# Patient Record
Sex: Male | Born: 1995 | Race: Black or African American | Hispanic: No | Marital: Single | Smoking: Current some day smoker
Health system: Southern US, Community
[De-identification: ages and names within clinical notes are randomized; demographics above are authoritative.]

## PROBLEM LIST (undated history)

## (undated) ENCOUNTER — Emergency Department (HOSPITAL_COMMUNITY): Admission: EM | Payer: Self-pay | Source: Home / Self Care

## (undated) HISTORY — PX: WISDOM TOOTH EXTRACTION: SHX21

---
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
## 2016-04-03 ENCOUNTER — Emergency Department (HOSPITAL_COMMUNITY)
Admission: EM | Admit: 2016-04-03 | Discharge: 2016-04-03 | Disposition: A | Discharge status: Home / Self Care | Payer: Medicaid Other | Attending: Emergency Medicine | Admitting: Emergency Medicine

## 2016-04-03 ENCOUNTER — Encounter (HOSPITAL_COMMUNITY): Payer: Self-pay | Admitting: Emergency Medicine

## 2016-04-03 DIAGNOSIS — B009 Herpesviral infection, unspecified: Secondary | ICD-10-CM | POA: Diagnosis not present

## 2016-04-03 DIAGNOSIS — Z87891 Personal history of nicotine dependence: Secondary | ICD-10-CM | POA: Diagnosis not present

## 2016-04-03 DIAGNOSIS — K1379 Other lesions of oral mucosa: Secondary | ICD-10-CM | POA: Diagnosis present

## 2016-04-03 MED ORDER — ACYCLOVIR 800 MG PO TABS: 400.0000 mg | ORAL_TABLET | Freq: Four times a day (QID) | ORAL | 0 refills | Status: DC

## 2016-04-03 NOTE — ED Provider Notes (Signed)
[facility] Provider Note   CSN: [id number] Arrival date & time: 04/03/16  0908     History   Chief Complaint Chief Complaint  Patient presents with  . Mouth Lesions    HPI Kevin Cortez is a 20 y.o. male.  The history is provided by the patient. No language interpreter was used.  Mouth Lesions  Location:  Upper lip Upper lip location:  L outer Quality:  Blistered and red Onset quality:  Sudden Severity:  Moderate Progression:  Worsening Chronicity:  New Relieved by:  Nothing Worsened by:  Nothing Ineffective treatments:  None tried Associated symptoms: no rash and no sore throat   Pt complains of sores on the side of his mouth.  Pt has had in the past.  History reviewed. No pertinent past medical history.  There are no active problems to display for this patient.   History reviewed. No pertinent surgical history.     Home Medications    Prior to Admission medications   Medication Sig Start Date End Date Taking? Authorizing Provider  acyclovir (ZOVIRAX) 800 MG tablet Take 0.5 tablets (400 mg total) by mouth 4 (four) times daily. 04/03/16   Elson AreasLeslie K Shylynn Bruning, PA-C  ibuprofen (ADVIL,MOTRIN) 200 MG tablet Take 200 mg by mouth every 6 (six) hours as needed for headache.    Historical Provider, MD    Family History Family History  Problem Relation Age of Onset  . Hypertension Mother   . Hypertension Other     Social History Social History  Substance Use Topics  . Smoking status: Never Smoker  . Smokeless tobacco: Former NeurosurgeonUser  . Alcohol use No     Allergies   Amoxicillin   Review of Systems Review of Systems  HENT: Positive for mouth sores. Negative for sore throat.   Skin: Negative for rash.  All other systems reviewed and are negative.    Physical Exam Updated Vital Signs BP 141/95 (BP Location: Right Arm)   Pulse (!) 56   Temp 98.2 F (36.8 C) (Oral)   Resp 14   Ht 5\' 10"  (1.778 m)   Wt 70.3 kg   SpO2 100%   BMI 22.24 kg/m     Physical Exam  Constitutional: He is oriented to person, place, and time. He appears well-developed and well-nourished.  HENT:  Head: Normocephalic and atraumatic.  Right Ear: External ear normal.  Left Ear: External ear normal.  Nose: Nose normal.  Red raised blisters left outer upper lip.  Looks herpectic  Eyes: Conjunctivae and EOM are normal.  Neck: Normal range of motion. Neck supple.  Cardiovascular: Normal rate and regular rhythm.   No murmur heard. Pulmonary/Chest: Effort normal and breath sounds normal. No respiratory distress.  Abdominal: Soft. He exhibits no distension. There is no tenderness.  Musculoskeletal: Normal range of motion. He exhibits no edema.  Neurological: He is alert and oriented to person, place, and time.  Skin: Skin is warm and dry.  Psychiatric: He has a normal mood and affect.  Nursing note and vitals reviewed.    ED Treatments / Results  Labs (all labs ordered are listed, but only abnormal results are displayed) Labs Reviewed - No data to display  EKG  EKG Interpretation None       Radiology No results found.  Procedures Procedures (including critical care time)  Medications Ordered in ED Medications - No data to display   Initial Impression / Assessment and Plan / ED Course  I have reviewed the triage  vital signs and the nursing notes.  Pertinent labs & imaging results that were available during my care of the patient were reviewed by me and considered in my medical decision making (see chart for details).  Clinical Course    Pt counseled on probable herpes virus.  Pt given rx for acyclovir  Final Clinical Impressions(s) / ED Diagnoses   Final diagnoses:  Herpes simplex virus infection    New Prescriptions Discharge Medication List as of 04/03/2016  9:45 AM    START taking these medications   Details  acyclovir (ZOVIRAX) 800 MG tablet Take 0.5 tablets (400 mg total) by mouth 4 (four) times daily., Starting Wed  04/03/2016, Print         Lonia Skinner [place], PA-C 04/03/16 1142    Linwood Dibbles, MD 04/05/16 [phone]

## 2016-04-03 NOTE — ED Triage Notes (Signed)
Patient complaining of rash and soreness to left side of mouth since yesterday. States he has had a cold sore in same place in the past.

## 2016-04-29 ENCOUNTER — Emergency Department (HOSPITAL_COMMUNITY)
Admission: EM | Admit: 2016-04-29 | Discharge: 2016-04-30 | Disposition: A | Discharge status: Home / Self Care | Payer: Medicaid Other | Attending: Emergency Medicine | Admitting: Emergency Medicine

## 2016-04-29 ENCOUNTER — Encounter (HOSPITAL_COMMUNITY): Payer: Self-pay | Admitting: Emergency Medicine

## 2016-04-29 DIAGNOSIS — Z791 Long term (current) use of non-steroidal anti-inflammatories (NSAID): Secondary | ICD-10-CM | POA: Diagnosis not present

## 2016-04-29 DIAGNOSIS — Z87891 Personal history of nicotine dependence: Secondary | ICD-10-CM | POA: Insufficient documentation

## 2016-04-29 DIAGNOSIS — R109 Unspecified abdominal pain: Secondary | ICD-10-CM | POA: Insufficient documentation

## 2016-04-29 DIAGNOSIS — J029 Acute pharyngitis, unspecified: Secondary | ICD-10-CM | POA: Insufficient documentation

## 2016-04-29 MED ORDER — CEPHALEXIN 500 MG PO CAPS: 500.0000 mg | ORAL_CAPSULE | Freq: Four times a day (QID) | ORAL | 0 refills | Status: DC

## 2016-04-29 NOTE — ED Notes (Signed)
Pt came to registration asking for work note. Pt no longer in waiting room.

## 2016-04-29 NOTE — ED Provider Notes (Signed)
[facility] Provider Note   CSN: [id number] Arrival date & time: 04/29/16  1948     History   Chief Complaint Chief Complaint  Patient presents with  . Sore Throat  . Abdominal Pain    HPI Kevin Cortez is a 20 y.o. male.  The history is provided by the patient. No language interpreter was used.  Sore Throat  This is a new problem. The current episode started 2 days ago. The problem has been gradually worsening. Associated symptoms include abdominal pain. Nothing aggravates the symptoms. Nothing relieves the symptoms. He has tried nothing for the symptoms.  Abdominal Pain    Pt reports no current abdominal pain.  Pt reports he had pain earlier today.    History reviewed. No pertinent past medical history.  There are no active problems to display for this patient.   History reviewed. No pertinent surgical history.     Home Medications    Prior to Admission medications   Medication Sig Start Date End Date Taking? Authorizing Provider  acyclovir (ZOVIRAX) 800 MG tablet Take 0.5 tablets (400 mg total) by mouth 4 (four) times daily. 04/03/16   Elson Areas, PA-C  ibuprofen (ADVIL,MOTRIN) 200 MG tablet Take 200 mg by mouth every 6 (six) hours as needed for headache.    Historical Provider, MD    Family History Family History  Problem Relation Age of Onset  . Hypertension Mother   . Hypertension Other     Social History Social History  Substance Use Topics  . Smoking status: Never Smoker  . Smokeless tobacco: Former Neurosurgeon  . Alcohol use No     Allergies   Amoxicillin   Review of Systems Review of Systems  Gastrointestinal: Positive for abdominal pain.  All other systems reviewed and are negative.    Physical Exam Updated Vital Signs BP 135/87   Pulse 66   Temp 98 F (36.7 C) (Oral)   Resp 17   Ht 5\' 10"  (1.778 m)   Wt 70.3 kg   SpO2 100%   BMI 22.24 kg/m   Physical Exam  Constitutional: He appears well-developed and  well-nourished.  HENT:  Head: Normocephalic and atraumatic.  Erythema throat, no exudate  Eyes: Conjunctivae are normal.  Neck: Neck supple.  Cardiovascular: Normal rate and regular rhythm.   No murmur heard. Pulmonary/Chest: Effort normal and breath sounds normal. No respiratory distress.  Abdominal: Soft. There is no tenderness.  Musculoskeletal: He exhibits no edema.  Neurological: He is alert.  Skin: Skin is warm and dry.  Psychiatric: He has a normal mood and affect.  Nursing note and vitals reviewed.    ED Treatments / Results  Labs (all labs ordered are listed, but only abnormal results are displayed) Labs Reviewed - No data to display  EKG  EKG Interpretation None       Radiology No results found.  Procedures Procedures (including critical care time)  Medications Ordered in ED Medications - No data to display   Initial Impression / Assessment and Plan / ED Course  I have reviewed the triage vital signs and the nursing notes.  Pertinent labs & imaging results that were available during my care of the patient were reviewed by me and considered in my medical decision making (see chart for details).  Clinical Course    No outpatient prescriptions have been marked as taking for the 04/29/16 encounter [facility] Encounter).    Final Clinical Impressions(s) / ED Diagnoses   Final diagnoses:  Pharyngitis,  unspecified etiology    New Prescriptions New Prescriptions   CEPHALEXIN (KEFLEX) 500 MG CAPSULE    Take 1 capsule (500 mg total) by mouth 4 (four) times daily.  An After Visit Summary was printed and given to the patient.   Lonia SkinnerLeslie K LingleSofia, PA-C 04/29/16 2335    Canary Brimhristopher J Tegeler, MD 05/01/16 [phone]

## 2016-04-29 NOTE — ED Triage Notes (Signed)
Pt states throat and abdomen began hurting last week. Pt denies N/V/D.

## 2016-07-04 ENCOUNTER — Emergency Department (HOSPITAL_COMMUNITY)
Admission: EM | Admit: 2016-07-04 | Discharge: 2016-07-04 | Disposition: A | Discharge status: Home / Self Care | Payer: Medicaid Other | Attending: Emergency Medicine | Admitting: Emergency Medicine

## 2016-07-04 ENCOUNTER — Encounter (HOSPITAL_COMMUNITY): Payer: Self-pay | Admitting: *Deleted

## 2016-07-04 ENCOUNTER — Emergency Department (HOSPITAL_COMMUNITY): Payer: Medicaid Other

## 2016-07-04 DIAGNOSIS — Z87891 Personal history of nicotine dependence: Secondary | ICD-10-CM | POA: Diagnosis not present

## 2016-07-04 DIAGNOSIS — R69 Illness, unspecified: Secondary | ICD-10-CM

## 2016-07-04 DIAGNOSIS — R05 Cough: Secondary | ICD-10-CM | POA: Diagnosis present

## 2016-07-04 DIAGNOSIS — B349 Viral infection, unspecified: Secondary | ICD-10-CM | POA: Diagnosis not present

## 2016-07-04 DIAGNOSIS — J111 Influenza due to unidentified influenza virus with other respiratory manifestations: Secondary | ICD-10-CM

## 2016-07-04 MED ORDER — BENZONATATE 100 MG PO CAPS: 100.0000 mg | ORAL_CAPSULE | Freq: Three times a day (TID) | ORAL | 0 refills | Status: DC

## 2016-07-04 MED ORDER — IBUPROFEN 800 MG PO TABS
800.0000 mg | ORAL_TABLET | Freq: Once | ORAL | Status: AC
  Administered 2016-07-04: 800 mg via ORAL
  Filled 2016-07-04: qty 1

## 2016-07-04 NOTE — Discharge Instructions (Signed)
You likely have a flu-like illness. This can take 1-2 weeks to improve on it's own. Drink plenty of fluids and get rest. Take ibuprofen or tylenol for aches and pains  Return for worsening symptoms, including new high fevers, confusion, difficulty breathing,  intractable vomiting, or any other symptoms concerning to you.

## 2016-07-04 NOTE — ED Provider Notes (Signed)
[facility] Provider Note   CSN: [id number] Arrival date & time: 07/04/16  0106     History   Chief Complaint Chief Complaint  Patient presents with  . Generalized Body Aches    HPI Lauro RegulusWalter L Sippel is a 20 y.o. male.  HPI 20 year old male who presents with cough and generalized body aches. Symptoms have been ongoing for 4 days. He has minimally productive cough but no shortness of breath or chest pain. Has had sore throat, congestion, and runny nose, and fullness in his ears. No known fevers or chills. Has had generalized myalgias and arthralgias. Has had some loose stools but no vomiting. Unknown sick contacts. Drinking fluids adequately at home. Has not taken medications at home.    History reviewed. No pertinent past medical history.  There are no active problems to display for this patient.   History reviewed. No pertinent surgical history.     Home Medications    Prior to Admission medications   Medication Sig Start Date End Date Taking? Authorizing Provider  benzonatate (TESSALON) 100 MG capsule Take 1 capsule (100 mg total) by mouth every 8 (eight) hours. 07/04/16   Lavera Guiseana Duo Georgeanne Frankland, MD    Family History Family History  Problem Relation Age of Onset  . Hypertension Mother   . Hypertension Other     Social History Social History  Substance Use Topics  . Smoking status: Never Smoker  . Smokeless tobacco: Former NeurosurgeonUser  . Alcohol use No     Allergies   Amoxicillin   Review of Systems Review of Systems 10/14 systems reviewed and are negative other than those stated in the HPI   Physical Exam Updated Vital Signs BP 127/85 (BP Location: Left Arm)   Pulse 73   Temp 98.2 F (36.8 C) (Oral)   Resp 16   Ht 5' 9.5" (1.765 m)   Wt 155 lb (70.3 kg)   SpO2 99%   BMI 22.56 kg/m   Physical Exam Physical Exam  Nursing note and vitals reviewed. Constitutional: non-toxic, and in no acute distress Head: Normocephalic and atraumatic.    Mouth/Throat: Oropharynx is clear and moist.  Neck: Normal range of motion. Neck supple.  Cardiovascular: Normal rate and regular rhythm.   Pulmonary/Chest: Effort normal and breath sounds normal.  Abdominal: Soft. There is no tenderness. There is no rebound and no guarding.  Musculoskeletal: Normal range of motion.  Neurological: Alert, no facial droop, fluent speech, moves all extremities symmetrically Skin: Skin is warm and dry.  Psychiatric: Cooperative   ED Treatments / Results  Labs (all labs ordered are listed, but only abnormal results are displayed) Labs Reviewed - No data to display  EKG  EKG Interpretation None       Radiology Dg Chest 2 View  Result Date: 07/04/2016 CLINICAL DATA:  Cough and body aches for 1 week. EXAM: CHEST  2 VIEW COMPARISON:  01/15/2013 FINDINGS: The cardiomediastinal contours are normal. The lungs are clear. Pulmonary vasculature is normal. No consolidation, pleural effusion, or pneumothorax. No acute osseous abnormalities are seen. IMPRESSION: No active cardiopulmonary disease. Electronically Signed   By: Rubye OaksMelanie  Ehinger M.D.   On: 07/04/2016 02:40    Procedures Procedures (including critical care time)  Medications Ordered in ED Medications  ibuprofen (ADVIL,MOTRIN) tablet 800 mg (800 mg Oral Given 07/04/16 0232)     Initial Impression / Assessment and Plan / ED Course  I have reviewed the triage vital signs and the nursing notes.  Pertinent labs & imaging results  that were available during my care of the patient were reviewed by me and considered in my medical decision making (see chart for details).  Clinical Course     Presenting with flulike illness. He is nontoxic in no acute distress with normal vital signs. Chest x-ray visualized and shows no infiltrate, edema or other acute cardiopulmonary processes. Discussed supportive care management for home. Strict return and follow-up instructions reviewed. He expressed understanding  of all discharge instructions and felt comfortable with the plan of care.   Final Clinical Impressions(s) / ED Diagnoses   Final diagnoses:  Influenza-like illness  Viral syndrome    New Prescriptions New Prescriptions   BENZONATATE (TESSALON) 100 MG CAPSULE    Take 1 capsule (100 mg total) by mouth every 8 (eight) hours.     Lavera Guiseana Duo Maguadalupe Lata, MD 07/04/16 [phone]

## 2016-07-04 NOTE — ED Triage Notes (Signed)
Pt c/o generalized body aches with chills, nausea and diarrhea

## 2016-07-28 ENCOUNTER — Emergency Department (HOSPITAL_COMMUNITY)
Admission: EM | Admit: 2016-07-28 | Discharge: 2016-07-28 | Disposition: A | Discharge status: Home / Self Care | Payer: Medicaid Other | Attending: Emergency Medicine | Admitting: Emergency Medicine

## 2016-07-28 ENCOUNTER — Encounter (HOSPITAL_COMMUNITY): Payer: Self-pay | Admitting: Emergency Medicine

## 2016-07-28 DIAGNOSIS — Z87891 Personal history of nicotine dependence: Secondary | ICD-10-CM | POA: Insufficient documentation

## 2016-07-28 DIAGNOSIS — R69 Illness, unspecified: Secondary | ICD-10-CM

## 2016-07-28 DIAGNOSIS — R05 Cough: Secondary | ICD-10-CM | POA: Diagnosis not present

## 2016-07-28 DIAGNOSIS — R197 Diarrhea, unspecified: Secondary | ICD-10-CM | POA: Diagnosis not present

## 2016-07-28 DIAGNOSIS — J029 Acute pharyngitis, unspecified: Secondary | ICD-10-CM | POA: Diagnosis not present

## 2016-07-28 DIAGNOSIS — R109 Unspecified abdominal pain: Secondary | ICD-10-CM | POA: Insufficient documentation

## 2016-07-28 DIAGNOSIS — R51 Headache: Secondary | ICD-10-CM | POA: Diagnosis present

## 2016-07-28 DIAGNOSIS — J111 Influenza due to unidentified influenza virus with other respiratory manifestations: Secondary | ICD-10-CM

## 2016-07-28 MED ORDER — ONDANSETRON 8 MG PO TBDP: 8.0000 mg | ORAL_TABLET | Freq: Three times a day (TID) | ORAL | 0 refills | Status: DC | PRN

## 2016-07-28 MED ORDER — GUAIFENESIN-DM 100-10 MG/5ML PO SYRP: 5.0000 mL | ORAL_SOLUTION | ORAL | 0 refills | Status: DC | PRN

## 2016-07-28 NOTE — Discharge Instructions (Signed)
Take Tylenol for body aches and fever. Drink plenty of fluids. Rest. Take Zofran as prescribed as needed for nausea. Take cough medication as prescribed as needed for cough. Follow-up with family doctor not improving in 3-5 days. Return if worsening symptoms.

## 2016-07-28 NOTE — ED Provider Notes (Signed)
[facility]-EMER[facility] DEPT Provider Note   CSN: [id number] Arrival date & time: 07/28/16  1219  By signing my name below, I, Kevin Cortez, attest that this documentation has been prepared under the direction and in the presence of Kevin Crumbleatyana Aryel Edelen, PA-C Electronically Signed: Cynda AcresHailei Cortez, Scribe. 07/28/16. 12:56 PM.  History   Chief Complaint Chief Complaint  Patient presents with  . Generalized Body Aches    HPI Comments: Kevin Cortez is a 21 y.o. male who presents to the Emergency Department complaining of constant generalized body aches that began 3 days ago. Patient reports an associated cough, abdominal pain, sore throat, diarrhea, and a headache. Patient states he works in a cold environment. Patient reports taking unknown migraine medication yesterday. He denies any sick contacts, fever, or vomiting. Unsure if had flu shot this year. No medications taken prior to coming in today.   The history is provided by the patient. No language interpreter was used.    History reviewed. No pertinent past medical history.  There are no active problems to display for this patient.   History reviewed. No pertinent surgical history.     Home Medications    Prior to Admission medications   Medication Sig Start Date End Date Taking? Authorizing Provider  benzonatate (TESSALON) 100 MG capsule Take 1 capsule (100 mg total) by mouth every 8 (eight) hours. Patient not taking: Reported on 07/28/2016 07/04/16   Kevin Guiseana Duo Liu, MD    Family History Family History  Problem Relation Age of Onset  . Hypertension Mother   . Hypertension Other     Social History Social History  Substance Use Topics  . Smoking status: Never Smoker  . Smokeless tobacco: Former NeurosurgeonUser  . Alcohol use No     Allergies   Amoxicillin   Review of Systems Review of Systems  Constitutional: Negative for chills and fever.  HENT: Positive for sore throat.   Respiratory: Positive for cough.     Gastrointestinal: Positive for abdominal pain and diarrhea. Negative for vomiting.  Musculoskeletal: Positive for myalgias.  Neurological: Positive for headaches.     Physical Exam Updated Vital Signs BP 129/97 (BP Location: Left Arm)   Pulse 83   Temp 98.7 F (37.1 C) (Oral)   Resp 18   Ht 5\' 10"  (1.778 m)   Wt 155 lb (70.3 kg)   SpO2 100%   BMI 22.24 kg/m   Physical Exam  Constitutional: He is oriented to person, place, and time. He appears well-developed and well-nourished. No distress.  HENT:  Head: Normocephalic and atraumatic.  Right Ear: External ear normal.  Left Ear: External ear normal.  Mouth/Throat: Oropharynx is clear and moist.  Clear rhinorrhea, pharynx erythemous, uvula midline  Eyes: Conjunctivae are normal.  Neck: Normal range of motion. Neck supple.  No meningeal signs  Cardiovascular: Normal rate, regular rhythm and normal heart sounds.   Pulmonary/Chest: Effort normal and breath sounds normal. No respiratory distress. He has no wheezes. He has no rales.  Abdominal: Soft. Bowel sounds are normal. There is no tenderness.  Musculoskeletal: He exhibits no edema or tenderness.  Lymphadenopathy:    He has no cervical adenopathy.  Neurological: He is alert and oriented to person, place, and time.  Skin: Skin is warm and dry. No erythema.  Psychiatric: He has a normal mood and affect.  Nursing note and vitals reviewed.    ED Treatments / Results  DIAGNOSTIC STUDIES: Oxygen Saturation is 100% on RA, normal by my interpretation.    COORDINATION  OF CARE: 12:53 PM Discussed treatment plan with pt at bedside and pt agreed to plan.  Labs (all labs ordered are listed, but only abnormal results are displayed) Labs Reviewed - No data to display  EKG  EKG Interpretation None       Radiology No results found.  Procedures Procedures (including critical care time)  Medications Ordered in ED Medications - No data to display   Initial Impression  / Assessment and Plan / ED Course  I have reviewed the triage vital signs and the nursing notes.  Pertinent labs & imaging results that were available during my care of the patient were reviewed by me and considered in my medical decision making (see chart for details).    Patient in emergency department with flulike symptoms. He is afebrile, normal vital signs, nontoxic appearing. Complaining of abdominal pain and diarrhea, abdomen is soft on exam, nontender. Lungs are clear. Oxygen saturation is 100% on room air. No respiratory distress. Oropharynx erythematous, otherwise no evidence of peritonsillar abscess or retropharyngeal abscess. Stable for discharge home, symptomatically treatment. We'll give her scheduled for Zofran for nausea. Will also treat with Tylenol, Motrin for any body aches and fever, Robitussin-DM for cough, rest, oral hydration at home, follow up as needed.  Vitals:   07/28/16 1234 07/28/16 1235  BP: 129/97   Pulse: 83   Resp: 18   Temp: 98.7 F (37.1 C)   TempSrc: Oral   SpO2: 100%   Weight:  70.3 kg  Height:  5\' 10"  (1.778 m)     Final Clinical Impressions(s) / ED Diagnoses   Final diagnoses:  Influenza-like illness    New Prescriptions New Prescriptions   GUAIFENESIN-DEXTROMETHORPHAN (ROBITUSSIN DM) 100-10 MG/5ML SYRUP    Take 5 mLs by mouth every 4 (four) hours as needed for cough.   ONDANSETRON (ZOFRAN ODT) 8 MG DISINTEGRATING TABLET    Take 1 tablet (8 mg total) by mouth every 8 (eight) hours as needed for nausea or vomiting.    I personally performed the services described in this documentation, which was scribed in my presence. The recorded information has been reviewed and is accurate.    Kevin Crumble, PA-C 07/28/16 1300    Kevin Bilis, MD 07/28/16 [phone]

## 2016-07-28 NOTE — ED Triage Notes (Signed)
PT c/o generalized body aches and cough x2 days. PT denies any OTC medications today and denies fever.

## 2016-07-29 ENCOUNTER — Emergency Department (HOSPITAL_COMMUNITY)
Admission: EM | Admit: 2016-07-29 | Discharge: 2016-07-29 | Disposition: A | Discharge status: Home / Self Care | Payer: Medicaid Other | Attending: Emergency Medicine | Admitting: Emergency Medicine

## 2016-07-29 ENCOUNTER — Encounter (HOSPITAL_COMMUNITY): Payer: Self-pay | Admitting: Emergency Medicine

## 2016-07-29 DIAGNOSIS — R51 Headache: Secondary | ICD-10-CM | POA: Diagnosis present

## 2016-07-29 DIAGNOSIS — J020 Streptococcal pharyngitis: Secondary | ICD-10-CM

## 2016-07-29 DIAGNOSIS — Z87891 Personal history of nicotine dependence: Secondary | ICD-10-CM | POA: Insufficient documentation

## 2016-07-29 DIAGNOSIS — Z79899 Other long term (current) drug therapy: Secondary | ICD-10-CM | POA: Diagnosis not present

## 2016-07-29 DIAGNOSIS — R109 Unspecified abdominal pain: Secondary | ICD-10-CM | POA: Diagnosis not present

## 2016-07-29 LAB — RAPID STREP SCREEN (MED CTR MEBANE ONLY): STREPTOCOCCUS, GROUP A SCREEN (DIRECT): POSITIVE — AB

## 2016-07-29 MED ORDER — CEPHALEXIN 500 MG PO CAPS
500.0000 mg | ORAL_CAPSULE | Freq: Once | ORAL | Status: AC
Start: 2016-07-29 — End: 2016-07-29
  Administered 2016-07-29: 500 mg via ORAL
  Filled 2016-07-29: qty 1

## 2016-07-29 MED ORDER — CEPHALEXIN 500 MG PO CAPS: 500.0000 mg | ORAL_CAPSULE | Freq: Two times a day (BID) | ORAL | 0 refills | Status: DC

## 2016-07-29 NOTE — Discharge Instructions (Signed)
Its important to drink plenty of fluids.  Alternate tylenol and ibuprofen 600 mg every 6 hrs for fever and body aches.  Be sure to take the antibiotic as directed for the entire 10 days.  Follow-up with your doctor for recheck if needed.

## 2016-07-29 NOTE — ED Triage Notes (Signed)
Pt c/o continued n/v/d and body aches. Pt seen in ED yesterday for same.

## 2016-07-29 NOTE — ED Provider Notes (Signed)
[facility] Provider Note   CSN: [id number] Arrival date & time: 07/29/16  1637  By signing my name below, I, [name], attest that this documentation has been prepared under the direction and in the presence of Lanice Folden PA-C.  Electronically Signed: [name], Scribe. 07/29/16. 7:12 PM.   History   Chief Complaint Chief Complaint  Patient presents with  . Abdominal Pain    HPI Comments: Kevin Cortez is a 21 y.o. male who presents to the Emergency Department complaining of abdominal pain that began 4 days ago. Patient states he was here yesterday and was told to return if symptoms worsened. Patient has associated generalized weakness, sore throat, headache, nausea, cough, vomiting, and diarrhea. Patient reports 2 vomiting episodes prior to arrival,  Patient has not taken  The medication prescribed to him. Patient does report taking tylenol. He denies any fever or any other symptoms.   The history is provided by the patient. No language interpreter was used.    History reviewed. No pertinent past medical history.  There are no active problems to display for this patient.   History reviewed. No pertinent surgical history.     Home Medications    Prior to Admission medications   Medication Sig Start Date End Date Taking? Authorizing Provider  benzonatate (TESSALON) 100 MG capsule Take 1 capsule (100 mg total) by mouth every 8 (eight) hours. Patient not taking: Reported on 07/28/2016 07/04/16   Lavera Guise, MD  guaiFENesin-dextromethorphan [facility] DM) 100-10 MG/5ML syrup Take 5 mLs by mouth every 4 (four) hours as needed for cough. 07/28/16   Tatyana Kirichenko, PA-C  ondansetron (ZOFRAN ODT) 8 MG disintegrating tablet Take 1 tablet (8 mg total) by mouth every 8 (eight) hours as needed for nausea or vomiting. 07/28/16   [name], PA-C    Family History Family History  Problem Relation Age of Onset  . Hypertension Mother   . Hypertension  Other     Social History Social History  Substance Use Topics  . Smoking status: Never Smoker  . Smokeless tobacco: Former Neurosurgeon  . Alcohol use No     Allergies   Amoxicillin   Review of Systems Review of Systems  Constitutional: Negative for chills and fever.  HENT: Positive for sore throat and trouble swallowing.   Respiratory: Positive for cough.   Gastrointestinal: Positive for abdominal pain, diarrhea and vomiting.  Neurological: Positive for weakness and headaches.     Physical Exam Updated Vital Signs BP 121/85 (BP Location: Left Arm)   Pulse 95   Temp 98.6 F (37 C) (Oral)   Resp 18   SpO2 99%   Physical Exam  Constitutional: He is oriented to person, place, and time. He appears well-developed and well-nourished.  HENT:  Head: Normocephalic and atraumatic.  erythema and edema of the oropharynx. No exudate. Uvula midline.   Neck: Normal range of motion. Neck supple.  Cardiovascular: Normal rate, regular rhythm and normal heart sounds.   No murmur heard. Pulmonary/Chest: Effort normal and breath sounds normal.  Abdominal: Soft. He exhibits no distension. There is no tenderness. There is no guarding.  Musculoskeletal: Normal range of motion.  Lymphadenopathy:    He has no cervical adenopathy.  Neurological: He is alert and oriented to person, place, and time.  Skin: Skin is warm and dry. No rash noted.     ED Treatments / Results  DIAGNOSTIC STUDIES: Oxygen Saturation is 99% on RA, normal by my interpretation.    COORDINATION OF  CARE: 7:09 PM Discussed treatment plan with pt at bedside and pt agreed to plan.  Labs (all labs ordered are listed, but only abnormal results are displayed) Labs Reviewed  RAPID STREP SCREEN (NOT AT [facility]) - Abnormal; Notable for the following:       Result Value   Streptococcus, Group A Screen (Direct) POSITIVE (*)    All other components within normal limits    EKG  EKG Interpretation None       Radiology No  results found.  Procedures Procedures (including critical care time)  Medications Ordered in ED Medications  cephALEXin (KEFLEX) capsule 500 mg (500 mg Oral Given 07/29/16 2057)     Initial Impression / Assessment and Plan / ED Course  I have reviewed the triage vital signs and the nursing notes.  Pertinent labs & imaging results that were available during my care of the patient were reviewed by me and considered in my medical decision making (see chart for details).   Patient is well appearing, afebrile at present. Here one day prior, diagnosed with viral illness.  Strep screen positive, PCN allergic, rx for keflex.  Pt appears stable for d/c agrees to plan   Final Clinical Impressions(s) / ED Diagnoses   Final diagnoses:  Strep throat    New Prescriptions Discharge Medication List as of 07/29/2016  8:50 PM    START taking these medications   Details  cephALEXin (KEFLEX) 500 MG capsule Take 1 capsule (500 mg total) by mouth 2 (two) times daily. For 10 days, Starting Mon 07/29/2016, Print       I personally performed the services described in this documentation, which was scribed in my presence. The recorded information has been reviewed and is accurate.     Pauline Ausammy Geraldine Sandberg, PA-C 08/01/16 1247    Bethann BerkshireJoseph Zammit, MD 08/01/16 [phone]

## 2016-09-19 ENCOUNTER — Encounter (HOSPITAL_COMMUNITY): Payer: Self-pay | Admitting: Emergency Medicine

## 2016-09-19 ENCOUNTER — Emergency Department (HOSPITAL_COMMUNITY)
Admission: EM | Admit: 2016-09-19 | Discharge: 2016-09-19 | Disposition: A | Discharge status: Home / Self Care | Payer: Medicaid Other | Attending: Emergency Medicine | Admitting: Emergency Medicine

## 2016-09-19 DIAGNOSIS — K625 Hemorrhage of anus and rectum: Secondary | ICD-10-CM | POA: Diagnosis present

## 2016-09-19 DIAGNOSIS — K602 Anal fissure, unspecified: Secondary | ICD-10-CM | POA: Diagnosis not present

## 2016-09-19 LAB — CBC
HEMATOCRIT: 39.6 % (ref 39.0–52.0)
HEMOGLOBIN: 13.5 g/dL (ref 13.0–17.0)
MCH: 29.6 pg (ref 26.0–34.0)
MCHC: 34.1 g/dL (ref 30.0–36.0)
MCV: 86.8 fL (ref 78.0–100.0)
Platelets: 240 10*3/uL (ref 150–400)
RBC: 4.56 MIL/uL (ref 4.22–5.81)
RDW: 12.2 % (ref 11.5–15.5)
WBC: 4.4 10*3/uL (ref 4.0–10.5)

## 2016-09-19 LAB — BASIC METABOLIC PANEL
Anion gap: 7 (ref 5–15)
BUN: 18 mg/dL (ref 6–20)
CHLORIDE: 105 mmol/L (ref 101–111)
CO2: 26 mmol/L (ref 22–32)
CREATININE: 1.02 mg/dL (ref 0.61–1.24)
Calcium: 9.1 mg/dL (ref 8.9–10.3)
GFR calc non Af Amer: >60 mL/min (ref >60)
GLUCOSE: 96 mg/dL (ref 65–99)
Potassium: 3.5 mmol/L (ref 3.5–5.1)
Sodium: 138 mmol/L (ref 135–145)

## 2016-09-19 NOTE — ED Notes (Signed)
Pt departed in NAD.  

## 2016-09-19 NOTE — ED Notes (Signed)
ED Provider at bedside. 

## 2016-09-19 NOTE — ED Provider Notes (Signed)
[facility]-EMERGENCY DEPT Provider Note   CSN: [id number] Arrival date & time: 09/19/16  0108   History   Chief Complaint No chief complaint on file.   HPI Kevin Cortez is a 21 y.o. male.  The history is provided by the patient. No language interpreter was used.  Rectal Bleeding  Quality:  Bright red Amount:  Scant Duration:  12 months Timing:  Sporadic Chronicity:  Recurrent Context: defecation   Context comment:  Frequent BMs daily Similar prior episodes: yes   Relieved by:  Nothing Ineffective treatments:  None tried Associated symptoms: no abdominal pain, no fever, no hematemesis, no light-headedness and no vomiting   Risk factors: no anticoagulant use     History reviewed. No pertinent past medical history.  There are no active problems to display for this patient.   History reviewed. No pertinent surgical history.    Home Medications    Prior to Admission medications   Medication Sig Start Date End Date Taking? Authorizing Provider  benzonatate (TESSALON) 100 MG capsule Take 1 capsule (100 mg total) by mouth every 8 (eight) hours. Patient not taking: Reported on 07/28/2016 07/04/16   Lavera Guise, MD  cephALEXin (KEFLEX) 500 MG capsule Take 1 capsule (500 mg total) by mouth 2 (two) times daily. For 10 days 07/29/16   Tammy Triplett, PA-C  guaiFENesin-dextromethorphan (ROBITUSSIN DM) 100-10 MG/5ML syrup Take 5 mLs by mouth every 4 (four) hours as needed for cough. 07/28/16   Tatyana Kirichenko, PA-C  ondansetron (ZOFRAN ODT) 8 MG disintegrating tablet Take 1 tablet (8 mg total) by mouth every 8 (eight) hours as needed for nausea or vomiting. 07/28/16   [name], PA-C    Family History Family History  Problem Relation Age of Onset  . Hypertension Mother   . Hypertension Other     Social History Social History  Substance Use Topics  . Smoking status: Never Smoker  . Smokeless tobacco: Former Neurosurgeon  . Alcohol use No     Allergies   Amoxicillin  and Lactose intolerance (gi)   Review of Systems Review of Systems  Constitutional: Negative for fever.  Gastrointestinal: Positive for hematochezia. Negative for abdominal pain, hematemesis and vomiting.  Neurological: Negative for light-headedness.  Ten systems reviewed and are negative for acute change, except as noted in the HPI.    Physical Exam Updated Vital Signs BP 140/88 (BP Location: Right Arm)   Pulse 67   Temp 98 F (36.7 C) (Oral)   Resp 18   Ht 5\' 9"  (1.753 m)   Wt 70.3 kg   SpO2 99%   BMI 22.89 kg/m   Physical Exam  Constitutional: He is oriented to person, place, and time. He appears well-developed and well-nourished. No distress.  Nontoxic and in NAD  HENT:  Head: Normocephalic and atraumatic.  Eyes: Conjunctivae and EOM are normal. No scleral icterus.  Neck: Normal range of motion.  Pulmonary/Chest: Effort normal. No respiratory distress. He has no wheezes.  Respirations even and unlabored  Genitourinary: Rectal exam shows fissure. Rectal exam shows no external hemorrhoid and no tenderness.     Genitourinary Comments: Exam chaperoned by RNMarquita Palms. No perianal induration or TTP.  Musculoskeletal: Normal range of motion.  Neurological: He is alert and oriented to person, place, and time.  GCS 15. Patient ambulatory with steady gait.  Skin: Skin is warm and dry. No rash noted. He is not diaphoretic. No erythema. No pallor.  Psychiatric: He has a normal mood and affect. His behavior is  normal.  Nursing note and vitals reviewed.    ED Treatments / Results  Labs (all labs ordered are listed, but only abnormal results are displayed) Labs Reviewed  CBC  BASIC METABOLIC PANEL    EKG  EKG Interpretation None       Radiology No results found.  Procedures Procedures (including critical care time)  Medications Ordered in ED Medications - No data to display   Initial Impression / Assessment and Plan / ED Course  I have reviewed the triage  vital signs and the nursing notes.  Pertinent labs & imaging results that were available during my care of the patient were reviewed by me and considered in my medical decision making (see chart for details).     21 year old male presents to the emergency department for evaluation of one year of scant hematochezia associated with bowel movements. He states that he has noticed a small amount of bright red blood on the toilet tissue when wiping. He states that he has frequent bowel movements throughout the day. He has no pain with defecation. No melena. No associated abdominal pain, fevers, nausea, or vomiting.  Patient with anal fissure 2 on chaperoned rectal exam. These are nontender and are without secondary infection. Have counseled the patient on supportive care with sitz bath's. I have also recommended that he use moisturized towelettes when wiping after a bowel movement. Return precautions discussed and provided. Patient discharged in stable condition with no unaddressed concerns.   Final Clinical Impressions(s) / ED Diagnoses   Final diagnoses:  Anal fissure    New Prescriptions New Prescriptions   No medications on file     Antony MaduraKelly Emilyanne Mcgough, PA-C 09/19/16 0403    Zadie Rhineonald Wickline, MD 09/20/16 1010

## 2016-09-19 NOTE — ED Triage Notes (Addendum)
Pt reports bright red blood per rectum x1 year, states more concerned about it lately. Pt states only present when wiping. Never seen before for it. Denies pain. Pt states he is lactose intolerant.

## 2016-12-15 ENCOUNTER — Encounter (HOSPITAL_COMMUNITY): Payer: Self-pay | Admitting: *Deleted

## 2016-12-15 ENCOUNTER — Emergency Department (HOSPITAL_COMMUNITY)
Admission: EM | Admit: 2016-12-15 | Discharge: 2016-12-15 | Discharge status: Against Medical Advice | Payer: Medicaid Other | Attending: Emergency Medicine | Admitting: Emergency Medicine

## 2016-12-15 DIAGNOSIS — Z87891 Personal history of nicotine dependence: Secondary | ICD-10-CM | POA: Insufficient documentation

## 2016-12-15 DIAGNOSIS — K0889 Other specified disorders of teeth and supporting structures: Secondary | ICD-10-CM | POA: Insufficient documentation

## 2016-12-15 DIAGNOSIS — Z5321 Procedure and treatment not carried out due to patient leaving prior to being seen by health care provider: Secondary | ICD-10-CM | POA: Insufficient documentation

## 2016-12-15 NOTE — ED Triage Notes (Signed)
Pt c/o right upper dental pain that started yesterday. Denies fever.

## 2016-12-15 NOTE — ED Triage Notes (Signed)
Pt called from waiting room to bring back to fast track room. No answer x 2. Pt assumed to have left prior to being seen.

## 2016-12-15 NOTE — ED Triage Notes (Signed)
Called pt from waiting room to bring to fast track room. No answer.

## 2016-12-16 ENCOUNTER — Emergency Department (HOSPITAL_COMMUNITY)
Admission: EM | Admit: 2016-12-16 | Discharge: 2016-12-16 | Disposition: A | Discharge status: Home / Self Care | Payer: Self-pay | Attending: Emergency Medicine | Admitting: Emergency Medicine

## 2016-12-16 ENCOUNTER — Encounter (HOSPITAL_COMMUNITY): Payer: Self-pay | Admitting: Emergency Medicine

## 2016-12-16 DIAGNOSIS — K029 Dental caries, unspecified: Secondary | ICD-10-CM | POA: Insufficient documentation

## 2016-12-16 DIAGNOSIS — Z87891 Personal history of nicotine dependence: Secondary | ICD-10-CM | POA: Insufficient documentation

## 2016-12-16 MED ORDER — DICLOFENAC SODIUM 50 MG PO TBEC: 50.0000 mg | DELAYED_RELEASE_TABLET | Freq: Two times a day (BID) | ORAL | 0 refills | Status: DC

## 2016-12-16 MED ORDER — CLINDAMYCIN HCL 150 MG PO CAPS: 150.0000 mg | ORAL_CAPSULE | Freq: Four times a day (QID) | ORAL | 0 refills | Status: DC

## 2016-12-16 NOTE — ED Provider Notes (Signed)
[facility] Provider Note   CSN: [id number] Arrival date & time: 12/16/16  0900     History   Chief Complaint Chief Complaint  Patient presents with  . Dental Pain    HPI Kevin Cortez is a 21 y.o. male.  The history is provided by the patient. No language interpreter was used.  Dental Pain   This is a new problem. The current episode started more than 2 days ago. The problem occurs constantly. The problem has been gradually worsening. The pain is moderate. He has tried nothing for the symptoms. The treatment provided no relief.   Pt complains of a bad toothache.  Pt thinks he needs tooth pulled History reviewed. No pertinent past medical history.  There are no active problems to display for this patient.   Past Surgical History:  Procedure Laterality Date  . WISDOM TOOTH EXTRACTION         Home Medications    Prior to Admission medications   Medication Sig Start Date End Date Taking? Authorizing Provider  benzonatate (TESSALON) 100 MG capsule Take 1 capsule (100 mg total) by mouth every 8 (eight) hours. Patient not taking: Reported on 07/28/2016 07/04/16   Lavera Guise, MD  cephALEXin (KEFLEX) 500 MG capsule Take 1 capsule (500 mg total) by mouth 2 (two) times daily. For 10 days 07/29/16   Triplett, Tammy, PA-C  clindamycin (CLEOCIN) 150 MG capsule Take 1 capsule (150 mg total) by mouth every 6 (six) hours. 12/16/16   Elson Areas, PA-C  diclofenac (VOLTAREN) 50 MG EC tablet Take 1 tablet (50 mg total) by mouth 2 (two) times daily. 12/16/16   Elson Areas, PA-C  guaiFENesin-dextromethorphan (ROBITUSSIN DM) 100-10 MG/5ML syrup Take 5 mLs by mouth every 4 (four) hours as needed for cough. 07/28/16   Kirichenko, Lemont Fillers, PA-C  ondansetron (ZOFRAN ODT) 8 MG disintegrating tablet Take 1 tablet (8 mg total) by mouth every 8 (eight) hours as needed for nausea or vomiting. 07/28/16   [name], PA-C    Family History Family History  Problem Relation  Age of Onset  . Hypertension Mother   . Hypertension Other     Social History Social History  Substance Use Topics  . Smoking status: Never Smoker  . Smokeless tobacco: Former Neurosurgeon  . Alcohol use Yes     Allergies   Amoxicillin and Lactose intolerance (gi)   Review of Systems Review of Systems  All other systems reviewed and are negative.    Physical Exam Updated Vital Signs BP 140/90   Pulse 72   Temp 98.2 F (36.8 C)   Resp 18   SpO2 98%   Physical Exam  Constitutional: He is oriented to person, place, and time. He appears well-developed and well-nourished.  HENT:  Head: Normocephalic.  Broken upper 1st molar large cavity  Eyes: EOM are normal.  Neck: Normal range of motion.  Pulmonary/Chest: Effort normal.  Abdominal: He exhibits no distension.  Musculoskeletal: Normal range of motion.  Neurological: He is alert and oriented to person, place, and time.  Psychiatric: He has a normal mood and affect.  Nursing note and vitals reviewed.    ED Treatments / Results  Labs (all labs ordered are listed, but only abnormal results are displayed) Labs Reviewed - No data to display  EKG  EKG Interpretation None       Radiology No results found.  Procedures Procedures (including critical care time)  Medications Ordered in ED Medications - No data to  display   Initial Impression / Assessment and Plan / ED Course  I have reviewed the triage vital signs and the nursing notes.  Pertinent labs & imaging results that were available during my care of the patient were reviewed by me and considered in my medical decision making (see chart for details).       Final Clinical Impressions(s) / ED Diagnoses   Final diagnoses:  Dental caries    New Prescriptions Discharge Medication List as of 12/16/2016  9:58 AM    START taking these medications   Details  clindamycin (CLEOCIN) 150 MG capsule Take 1 capsule (150 mg total) by mouth every 6 (six) hours.,  Starting Mon 12/16/2016, Print    diclofenac (VOLTAREN) 50 MG EC tablet Take 1 tablet (50 mg total) by mouth 2 (two) times daily., Starting Mon 12/16/2016, Print      An After Visit Summary was printed and given to the patient.   Osie CheeksSofia, Brok Stocking K, PA-C 12/16/16 1021    Benjiman CorePickering, Nathan, MD 12/16/16 [phone]

## 2016-12-16 NOTE — ED Provider Notes (Signed)
[facility] DEPT Provider Note   CSN: [id number] Arrival date & time: 12/16/16  0900     History   Chief Complaint Chief Complaint  Patient presents with  . Dental Pain    HPI Kevin Cortez is a 21 y.o. male.  The history is provided by the patient. No language interpreter was used.  Dental Pain   This is a new problem. The problem occurs constantly. The problem has been gradually worsening. The pain is moderate. He has tried nothing for the symptoms. The treatment provided no relief.  Pt complains of a painful broken tooth  History reviewed. No pertinent past medical history.  There are no active problems to display for this patient.   Past Surgical History:  Procedure Laterality Date  . WISDOM TOOTH EXTRACTION         Home Medications    Prior to Admission medications   Medication Sig Start Date End Date Taking? Authorizing Provider  benzonatate (TESSALON) 100 MG capsule Take 1 capsule (100 mg total) by mouth every 8 (eight) hours. Patient not taking: Reported on 07/28/2016 07/04/16   Lavera Guise, MD  cephALEXin (KEFLEX) 500 MG capsule Take 1 capsule (500 mg total) by mouth 2 (two) times daily. For 10 days 07/29/16   Triplett, Tammy, PA-C  clindamycin (CLEOCIN) 150 MG capsule Take 1 capsule (150 mg total) by mouth every 6 (six) hours. 12/16/16   Elson Areas, PA-C  diclofenac (VOLTAREN) 50 MG EC tablet Take 1 tablet (50 mg total) by mouth 2 (two) times daily. 12/16/16   Elson Areas, PA-C  guaiFENesin-dextromethorphan (ROBITUSSIN DM) 100-10 MG/5ML syrup Take 5 mLs by mouth every 4 (four) hours as needed for cough. 07/28/16   Kirichenko, Lemont Fillers, PA-C  ondansetron (ZOFRAN ODT) 8 MG disintegrating tablet Take 1 tablet (8 mg total) by mouth every 8 (eight) hours as needed for nausea or vomiting. 07/28/16   [name], PA-C    Family History Family History  Problem Relation Age of Onset  . Hypertension Mother   . Hypertension Other     Social  History Social History  Substance Use Topics  . Smoking status: Never Smoker  . Smokeless tobacco: Former Neurosurgeon  . Alcohol use Yes     Allergies   Amoxicillin and Lactose intolerance (gi)   Review of Systems Review of Systems  All other systems reviewed and are negative.    Physical Exam Updated Vital Signs BP (!) 142/95 (BP Location: Right Arm)   Pulse 74   Temp 98.3 F (36.8 C) (Oral)   Resp 17   SpO2 96%   Physical Exam  Constitutional: He appears well-developed and well-nourished.  HENT:  Head: Normocephalic and atraumatic.  Large cavity right upper 1st molar  Eyes: Conjunctivae are normal.  Neck: Neck supple.  Cardiovascular: Normal rate and regular rhythm.   No murmur heard. Pulmonary/Chest: Effort normal and breath sounds normal. No respiratory distress.  Abdominal: Soft. There is no tenderness.  Musculoskeletal: He exhibits no edema.  Neurological: He is alert.  Skin: Skin is warm and dry.  Psychiatric: He has a normal mood and affect.  Nursing note and vitals reviewed.    ED Treatments / Results  Labs (all labs ordered are listed, but only abnormal results are displayed) Labs Reviewed - No data to display  EKG  EKG Interpretation None       Radiology No results found.  Procedures Procedures (including critical care time)  Medications Ordered in ED Medications - No  data to display   Initial Impression / Assessment and Plan / ED Course  I have reviewed the triage vital signs and the nursing notes.  Pertinent labs & imaging results that were available during my care of the patient were reviewed by me and considered in my medical decision making (see chart for details).       Final Clinical Impressions(s) / ED Diagnoses   Final diagnoses:  Dental caries    New Prescriptions New Prescriptions   CLINDAMYCIN (CLEOCIN) 150 MG CAPSULE    Take 1 capsule (150 mg total) by mouth every 6 (six) hours.   DICLOFENAC (VOLTAREN) 50 MG EC  TABLET    Take 1 tablet (50 mg total) by mouth 2 (two) times daily.  An After Visit Summary was printed and given to the patient.   Elson AreasSofia, Thaddeaus Monica K, PA-C 12/16/16 1000    Benjiman CorePickering, Nathan, MD 12/17/16 [phone]

## 2016-12-16 NOTE — ED Triage Notes (Signed)
Here yesterday but left after triage due to wait. C/o right upper tooth pain. Nad.

## 2016-12-18 ENCOUNTER — Encounter (HOSPITAL_COMMUNITY): Payer: Self-pay | Admitting: *Deleted

## 2016-12-18 ENCOUNTER — Emergency Department (HOSPITAL_COMMUNITY)
Admission: EM | Admit: 2016-12-18 | Discharge: 2016-12-18 | Disposition: A | Discharge status: Home Health | Payer: Self-pay | Attending: Emergency Medicine | Admitting: Emergency Medicine

## 2016-12-18 DIAGNOSIS — K0889 Other specified disorders of teeth and supporting structures: Secondary | ICD-10-CM

## 2016-12-18 DIAGNOSIS — Z791 Long term (current) use of non-steroidal anti-inflammatories (NSAID): Secondary | ICD-10-CM | POA: Insufficient documentation

## 2016-12-18 DIAGNOSIS — K029 Dental caries, unspecified: Secondary | ICD-10-CM | POA: Insufficient documentation

## 2016-12-18 MED ORDER — ACETAMINOPHEN 500 MG PO TABS
1000.0000 mg | ORAL_TABLET | Freq: Once | ORAL | Status: AC
  Administered 2016-12-18: 1000 mg via ORAL
  Filled 2016-12-18: qty 2

## 2016-12-18 MED ORDER — IBUPROFEN 400 MG PO TABS
600.0000 mg | ORAL_TABLET | Freq: Once | ORAL | Status: AC
  Administered 2016-12-18: 600 mg via ORAL
  Filled 2016-12-18: qty 2

## 2016-12-18 NOTE — ED Provider Notes (Signed)
[facility] Provider Note   CSN: [id number] Arrival date & time: 12/18/16  1508     History   Chief Complaint Chief Complaint  Patient presents with  . Dental Pain    HPI Kevin Cortez is a 21 y.o. male presents to the ED with gradually worsening right upper tooth pain 3 days. He reports cracked tooth for a few months. No associated facial swelling, trismus, drooling, fevers. Patient is currently taking antibiotic and diclofenac prescribed to him in ED 2 days ago. He reports antibiotic and diclofenac are not helping with pain. He got his wisdom teeth removed 2-3 months ago.  He does not have a dentist for routine dental care.   HPI  History reviewed. No pertinent past medical history.  There are no active problems to display for this patient.   Past Surgical History:  Procedure Laterality Date  . WISDOM TOOTH EXTRACTION         Home Medications    Prior to Admission medications   Medication Sig Start Date End Date Taking? Authorizing Provider  clindamycin (CLEOCIN) 150 MG capsule Take 1 capsule (150 mg total) by mouth every 6 (six) hours. 12/16/16  Yes Elson AreasSofia, Leslie K, PA-C  diclofenac (VOLTAREN) 50 MG EC tablet Take 1 tablet (50 mg total) by mouth 2 (two) times daily. 12/16/16  Yes Cheron SchaumannSofia, Leslie K, PA-C  ibuprofen (ADVIL,MOTRIN) 200 MG tablet Take 200 mg by mouth every 6 (six) hours as needed.   Yes [provider]    Family History Family History  Problem Relation Age of Onset  . Hypertension Mother   . Hypertension Other     Social History Social History  Substance Use Topics  . Smoking status: Never Smoker  . Smokeless tobacco: Former NeurosurgeonUser  . Alcohol use Yes     Allergies   Amoxicillin and Lactose intolerance (gi)   Review of Systems Review of Systems  Constitutional: Negative for fever.  HENT: Positive for dental problem. Negative for drooling, ear pain, facial swelling and trouble swallowing.      Physical Exam Updated  Vital Signs BP (!) 136/96 (BP Location: Right Arm)   Pulse (!) 57   Temp 98.7 F (37.1 C) (Oral)   Resp 18   Ht 5\' 9"  (1.753 m)   Wt 68 kg (150 lb)   SpO2 100%   BMI 22.15 kg/m   Physical Exam  Constitutional: He is oriented to person, place, and time. He appears well-developed and well-nourished. No distress.  NAD.  HENT:  Head: Normocephalic and atraumatic.  Right Ear: External ear normal.  Left Ear: External ear normal.  Nose: Nose normal.  Mouth/Throat: Abnormal dentition. Dental caries present.    Tooth #3 is cracked with significant cary. No surrounding gingival or gumline erythema, edema, fluctuance, bleeding or purulent drainage.  No sign of dental abscess.  Poor dentition. No facial or anterior neck edema, erythema or erythema. No sublingual edema or tenderness.  Soft palate flat without tenderness.  No trismus.   No pooling of oral secretions.  Phonation normal, no hot potato voice.  Maxilla and mandible nontender. Mastoids without edema, erythema or tenderness.    Eyes: Conjunctivae and EOM are normal. No scleral icterus.  Neck: Normal range of motion. Neck supple.  Cardiovascular: Normal rate, regular rhythm, normal heart sounds and intact distal pulses.   No murmur heard. Pulmonary/Chest: Effort normal and breath sounds normal. He has no wheezes.  Musculoskeletal: Normal range of motion. He exhibits no deformity.  Neurological:  He is alert and oriented to person, place, and time.  Skin: Skin is warm and dry. Capillary refill takes less than 2 seconds.  Psychiatric: He has a normal mood and affect. His behavior is normal. Judgment and thought content normal.  Nursing note and vitals reviewed.    ED Treatments / Results  Labs (all labs ordered are listed, but only abnormal results are displayed) Labs Reviewed - No data to display  EKG  EKG Interpretation None       Radiology No results found.  Procedures Procedures (including critical care  time)  Medications Ordered in ED Medications  ibuprofen (ADVIL,MOTRIN) tablet 600 mg (600 mg Oral Given 12/18/16 1558)  acetaminophen (TYLENOL) tablet 1,000 mg (1,000 mg Oral Given 12/18/16 1558)     Initial Impression / Assessment and Plan / ED Course  I have reviewed the triage vital signs and the nursing notes.  Pertinent labs & imaging results that were available during my care of the patient were reviewed by me and considered in my medical decision making (see chart for details).    Dental pain associated with dental cary and cracked tooth but no signs or symptoms of dental abscess on exam with patient afebrile, non toxic appearing, swallowing secretions well without hot potato voice. Exam unconcerning for Ludwig's angina or other deep tissue infection in neck.  Patient has been seen in ED for dental pain 3 times in the last 4 days.  Urged patient to follow-up with dentist.  I provided patient with multiple dental resources and dentist contact info, encouraged to follow up in 1-2 days for ultimate management of dental pain and overall dental health. Advised high dose NSAIDs, oragel and warm compresses. No indication for narcotic pain medication. Strict ED return precautions given. Pt is aware of red flag symptoms that would warrant return to ED for re-evaluation and further treatment. Patient voices understanding and is agreeable to plan.  Final Clinical Impressions(s) / ED Diagnoses   Final diagnoses:  Pain, dental  Pain due to dental caries    New Prescriptions Discharge Medication List as of 12/18/2016  4:05 PM       Liberty Handy, PA-C 12/18/16 1613    Samuel Jester, DO 12/21/16 1919

## 2016-12-18 NOTE — ED Triage Notes (Signed)
Pt comes in with right upper tooth pain. States he was seen here and given medication but it doesn't help.

## 2016-12-18 NOTE — Discharge Instructions (Signed)
You have a large cavity that is causing your pain. This tooth will probably need to be extracted.    Please take 600 mg ibuprofen + 1000 mg tylenol every 8 hours for pain. Oragel may help before meals to numb up the pain.   Your pain will continue until you are evaluated by a dentist and your tooth is extracted.   Avoid any hard, crunchy foods to avoid further tooth injury.   See attached dental resources for dental offices you may reach out to for dental care.

## 2018-01-27 ENCOUNTER — Emergency Department (HOSPITAL_COMMUNITY)
Admission: EM | Admit: 2018-01-27 | Discharge: 2018-01-27 | Disposition: A | Discharge status: Home / Self Care | Payer: PRIVATE HEALTH INSURANCE | Attending: Emergency Medicine | Admitting: Emergency Medicine

## 2018-01-27 ENCOUNTER — Encounter (HOSPITAL_COMMUNITY): Payer: Self-pay | Admitting: Emergency Medicine

## 2018-01-27 ENCOUNTER — Other Ambulatory Visit: Payer: Self-pay

## 2018-01-27 DIAGNOSIS — Z79899 Other long term (current) drug therapy: Secondary | ICD-10-CM | POA: Insufficient documentation

## 2018-01-27 DIAGNOSIS — H60502 Unspecified acute noninfective otitis externa, left ear: Secondary | ICD-10-CM | POA: Diagnosis not present

## 2018-01-27 DIAGNOSIS — H9202 Otalgia, left ear: Secondary | ICD-10-CM | POA: Diagnosis present

## 2018-01-27 DIAGNOSIS — J029 Acute pharyngitis, unspecified: Secondary | ICD-10-CM | POA: Insufficient documentation

## 2018-01-27 MED ORDER — ACETIC ACID 2 % OT SOLN: 4.0000 [drp] | Freq: Three times a day (TID) | OTIC | 0 refills | Status: AC

## 2018-01-27 MED ORDER — OFLOXACIN 0.3 % OT SOLN: 5.0000 [drp] | Freq: Every day | OTIC | 0 refills | Status: AC

## 2018-01-27 NOTE — ED Triage Notes (Signed)
Lt ear pain and cold s/s

## 2018-01-27 NOTE — Discharge Instructions (Signed)
Apply eardrops as prescribed.  You may take ibuprofen or Tylenol as needed for pain.  You can continue using the over-the-counter cold medicines that you have been taking since they have been helpful.  Drink plenty water and get plenty of rest.  You can use throat lozenges, warm teas, honey for sore throat.  Return to the emergency department if any concerning signs or symptoms develop such as worsening pain, high fevers, redness or swelling behind the ear, throat tightness, or drooling.

## 2018-01-27 NOTE — ED Notes (Signed)
Patient c/o L otalgia x 3 days.

## 2018-01-27 NOTE — ED Provider Notes (Signed)
[facility] EMERGENCY DEPARTMENT Provider Note   CSN: [id number] Arrival date & time: 01/27/18  1003     History   Chief Complaint Chief Complaint  Patient presents with  . Otalgia    HPI Kevin Cortez is a 22 y.o. male with no significant past medical history presents with complaint of acute onset, intermittent left ear pain for 3 days with associated sore throat which has since resolved.  He states that he will experience a moderate pain when he is eating.  He had a mild sore throat 2 days ago but this has since resolved.  He has a mild nonproductive cough.  Denies shortness of breath or chest pains.  No nasal congestion or headaches.  Denies fevers or chills.  He is used over-the-counter medicines with significant improvement in his symptoms.  He did apply Q-tip to the left ear yesterday without any relief.  States that he has a cousin with similar symptoms.  The history is provided by the patient.    History reviewed. No pertinent past medical history.  There are no active problems to display for this patient.   Past Surgical History:  Procedure Laterality Date  . WISDOM TOOTH EXTRACTION          Home Medications    Prior to Admission medications   Medication Sig Start Date End Date Taking? Authorizing Provider  acetic acid 2 % otic solution Place 4 drops into the left ear 3 (three) times daily for 7 days. 01/27/18 02/03/18  Michela Pitcher A, PA-C  clindamycin (CLEOCIN) 150 MG capsule Take 1 capsule (150 mg total) by mouth every 6 (six) hours. 12/16/16   Elson Areas, PA-C  diclofenac (VOLTAREN) 50 MG EC tablet Take 1 tablet (50 mg total) by mouth 2 (two) times daily. 12/16/16   Elson Areas, PA-C  ibuprofen (ADVIL,MOTRIN) 200 MG tablet Take 200 mg by mouth every 6 (six) hours as needed.    [provider]  ofloxacin (FLOXIN) 0.3 % OTIC solution Place 5 drops into the left ear daily for 7 days. 01/27/18 02/03/18  Jeanie Sewer, PA-C    Family History Family  History  Problem Relation Age of Onset  . Hypertension Mother   . Hypertension Other     Social History Social History   Tobacco Use  . Smoking status: Never Smoker  . Smokeless tobacco: Former Engineer, water Use Topics  . Alcohol use: Yes  . Drug use: No     Allergies   Amoxicillin and Lactose intolerance (gi)   Review of Systems Review of Systems  Constitutional: Negative for chills and fever.  HENT: Positive for ear pain and sore throat.   Respiratory: Positive for cough. Negative for shortness of breath.   Cardiovascular: Negative for chest pain.  Neurological: Negative for headaches.     Physical Exam Updated Vital Signs BP 132/87 (BP Location: Right Arm)   Pulse 69   Temp 98.5 F (36.9 C) (Oral)   Resp 18   Ht 5\' 9"  (1.753 m)   Wt 70.3 kg (155 lb)   SpO2 100%   BMI 22.89 kg/m   Physical Exam  Constitutional: He appears well-developed and well-nourished. No distress.  Resting comfortably in bed  HENT:  Head: Normocephalic and atraumatic.  Right TM without erythema or bulging.  No tenderness on palpation of the pinna or mastoid.  Left TM without erythema or bulging.  There is erythema of the external auditory canal with very mild swelling, no  abnormal drainage.  No tenderness on palpation of the pinna.  No mastoid tenderness or erythema.  No frontal or mastoid sinus tenderness.  Nasal septum midline without mucosal edema.  Posterior Chalmers GuestFranks with mild erythema but no tonsillar hypertrophy, exudates, uvular deviation, or trismus.  No sublingual abnormalities.  Patient tolerating secretions without difficulty.  Eyes: Pupils are equal, round, and reactive to light. Conjunctivae and EOM are normal. Right eye exhibits no discharge. Left eye exhibits no discharge.  Neck: Normal range of motion. Neck supple. No JVD present. No tracheal deviation present.  Cardiovascular: Normal rate, regular rhythm and normal heart sounds.  Pulmonary/Chest: Effort normal and  breath sounds normal. No stridor. No respiratory distress. He has no wheezes. He has no rales.  Abdominal: He exhibits no distension.  Musculoskeletal: He exhibits no edema.  Lymphadenopathy:    He has no cervical adenopathy.  Neurological: He is alert.  Skin: Skin is warm and dry. No erythema.  Psychiatric: He has a normal mood and affect. His behavior is normal.  Nursing note and vitals reviewed.    ED Treatments / Results  Labs (all labs ordered are listed, but only abnormal results are displayed) Labs Reviewed - No data to display  EKG None  Radiology No results found.  Procedures Procedures (including critical care time)  Medications Ordered in ED Medications - No data to display   Initial Impression / Assessment and Plan / ED Course  I have reviewed the triage vital signs and the nursing notes.  Pertinent labs & imaging results that were available during my care of the patient were reviewed by me and considered in my medical decision making (see chart for details).     Patient with 3-day history of left-sided otalgia which has improved as well as sore throat which is entirely resolved.  He is afebrile, vital signs are stable.  He is nontoxic in appearance.  No meningeal signs or fever.  Posterior oropharynx shows mild tonsillar hypertrophy and erythema but is not consistent with strep pharyngitis and I doubt strep in the absence of fever in the presence of a dry cough.  Examination of the left ear shows mild canal erythema and edema but no abnormal drainage, TM within normal limits.  No evidence of mastoiditis.  Consistent with mild otitis externa. No canal occlusion, Pt afebrile in NAD. Exam non concerning for mastoiditis, cellulitis or malignant OE.  Stable for discharge home with ofloxacin and Acetasol prescription.  Recommend symptomatic treatment of sore throat.  Discussed strict ED return precautions. Pt verbalized understanding of and agreement with plan and is safe  for discharge home at this time.  No complaints prior to discharge.  Final Clinical Impressions(s) / ED Diagnoses   Final diagnoses:  Acute otitis externa of left ear, unspecified type  Sore throat    ED Discharge Orders        Ordered    ofloxacin (FLOXIN) 0.3 % OTIC solution  Daily     01/27/18 1115    acetic acid 2 % otic solution  3 times daily     01/27/18 1115       Jeanie SewerFawze, Lavarius Doughten A, PA-C 01/27/18 1730    Mesner, Barbara CowerJason, MD 01/31/18 [phone]

## 2018-09-11 ENCOUNTER — Emergency Department (HOSPITAL_BASED_OUTPATIENT_CLINIC_OR_DEPARTMENT_OTHER)
Admission: EM | Admit: 2018-09-11 | Discharge: 2018-09-11 | Disposition: A | Discharge status: Home / Self Care | Payer: No Typology Code available for payment source | Attending: Emergency Medicine | Admitting: Emergency Medicine

## 2018-09-11 ENCOUNTER — Encounter (HOSPITAL_BASED_OUTPATIENT_CLINIC_OR_DEPARTMENT_OTHER): Payer: Self-pay | Admitting: Emergency Medicine

## 2018-09-11 ENCOUNTER — Other Ambulatory Visit: Payer: Self-pay

## 2018-09-11 DIAGNOSIS — N3942 Incontinence without sensory awareness: Secondary | ICD-10-CM | POA: Diagnosis not present

## 2018-09-11 DIAGNOSIS — R109 Unspecified abdominal pain: Secondary | ICD-10-CM | POA: Diagnosis present

## 2018-09-11 DIAGNOSIS — Z79899 Other long term (current) drug therapy: Secondary | ICD-10-CM | POA: Insufficient documentation

## 2018-09-11 LAB — URINALYSIS, ROUTINE W REFLEX MICROSCOPIC
Bilirubin Urine: NEGATIVE
Glucose, UA: NEGATIVE mg/dL
Hgb urine dipstick: NEGATIVE
Ketones, ur: 15 mg/dL — AB
LEUKOCYTE UA: NEGATIVE
Nitrite: NEGATIVE
Protein, ur: NEGATIVE mg/dL
Specific Gravity, Urine: 1.02 (ref 1.005–1.030)
pH: 7 (ref 5.0–8.0)

## 2018-09-11 NOTE — ED Provider Notes (Signed)
[facility] EMERGENCY DEPARTMENT Provider Note   CSN: [id number] Arrival date & time: 09/11/18  1152    History   Chief Complaint Chief Complaint  Patient presents with  . Urinary Retention    HPI Kevin Cortez is a 23 y.o. male.     HPI Patient is a 23 year old male that reports intermittent urinary incontinence over the past 1 week without abdominal or back pain.  He reports that when he urinates he feels like he is able to have a full stream and after he urinates sometimes he has a small additional amount of urine that comes out into his underwear.  No penile discharge.  It is been occurring intermittently over the past week.  No recent dietary changes.  Drinks an occasional soda.  No dysuria or urinary frequency.  No fevers or chills.  No nausea or vomiting.  No penile discharge.  No penile pain or testicular pain.  Check last week for STDs and did not hear any results from the clinic.  Has not seen a urologist.  Had a episode of urinary incontinence today while at work with a small amount of urine in his underwear and thus he came to the ER for evaluation.   History reviewed. No pertinent past medical history.  There are no active problems to display for this patient.   Past Surgical History:  Procedure Laterality Date  . WISDOM TOOTH EXTRACTION          Home Medications    Prior to Admission medications   Medication Sig Start Date End Date Taking? Authorizing Provider  clindamycin (CLEOCIN) 150 MG capsule Take 1 capsule (150 mg total) by mouth every 6 (six) hours. 12/16/16   Elson Areas, PA-C  diclofenac (VOLTAREN) 50 MG EC tablet Take 1 tablet (50 mg total) by mouth 2 (two) times daily. 12/16/16   Elson Areas, PA-C  ibuprofen (ADVIL,MOTRIN) 200 MG tablet Take 200 mg by mouth every 6 (six) hours as needed.    [provider]    Family History Family History  Problem Relation Age of Onset  . Hypertension Mother   . Hypertension Other       Social History Social History   Tobacco Use  . Smoking status: Never Smoker  . Smokeless tobacco: Former Engineer, water Use Topics  . Alcohol use: Yes  . Drug use: No     Allergies   Amoxicillin and Lactose intolerance (gi)   Review of Systems Review of Systems  All other systems reviewed and are negative.    Physical Exam Updated Vital Signs BP (!) 142/80 (BP Location: Right Arm)   Pulse 70   Temp 98 F (36.7 C) (Oral)   Resp 16   Ht 5\' 10"  (1.778 m)   Wt 70.3 kg   SpO2 100%   BMI 22.24 kg/m   Physical Exam Vitals signs and nursing note reviewed.  Constitutional:      Appearance: He is well-developed.  HENT:     Head: Normocephalic.  Neck:     Musculoskeletal: Normal range of motion.  Pulmonary:     Effort: Pulmonary effort is normal.  Abdominal:     General: There is no distension.  Genitourinary:    Comments: Deferred Musculoskeletal: Normal range of motion.  Neurological:     Mental Status: He is alert and oriented to person, place, and time.      ED Treatments / Results  Labs (all labs ordered are listed, but only  abnormal results are displayed) Labs Reviewed  URINALYSIS, ROUTINE W REFLEX MICROSCOPIC - Abnormal; Notable for the following components:      Result Value   Ketones, ur 15 (*)    All other components within normal limits    EKG None  Radiology No results found.  Procedures Procedures (including critical care time)  Medications Ordered in ED Medications - No data to display   Initial Impression / Assessment and Plan / ED Course  I have reviewed the triage vital signs and the nursing notes.  Pertinent labs & imaging results that were available during my care of the patient were reviewed by me and considered in my medical decision making (see chart for details).        No penile or testicular pain.  No penile discharge.  Recently checked for STDs.  Urine without signs of infection at this time.  GU exam  deferred at patient preference.  Outpatient urology follow-up if symptoms persist.  Bladder scan demonstrates less than 40 cc after urination.   Final Clinical Impressions(s) / ED Diagnoses   Final diagnoses:  Urinary incontinence without sensory awareness    ED Discharge Orders    None       Azalia Bilis, MD 09/11/18 1233

## 2018-09-11 NOTE — ED Triage Notes (Signed)
Has been having periods of dribbling urine. Had a u/a last week and was swabbed for STD and never heard back from them, denies pain or dysuria

## 2018-09-11 NOTE — Discharge Instructions (Addendum)
Call the urologist for follow up next week if symptoms persist

## 2019-01-04 ENCOUNTER — Encounter (HOSPITAL_COMMUNITY): Payer: Self-pay | Admitting: Emergency Medicine

## 2019-01-04 ENCOUNTER — Emergency Department (HOSPITAL_COMMUNITY)
Admission: EM | Admit: 2019-01-04 | Discharge: 2019-01-04 | Disposition: A | Discharge status: Home / Self Care | Payer: No Typology Code available for payment source | Attending: Emergency Medicine | Admitting: Emergency Medicine

## 2019-01-04 ENCOUNTER — Other Ambulatory Visit: Payer: Self-pay

## 2019-01-04 DIAGNOSIS — M545 Low back pain: Secondary | ICD-10-CM | POA: Insufficient documentation

## 2019-01-04 DIAGNOSIS — M5489 Other dorsalgia: Secondary | ICD-10-CM

## 2019-01-04 DIAGNOSIS — Z880 Allergy status to penicillin: Secondary | ICD-10-CM | POA: Insufficient documentation

## 2019-01-04 DIAGNOSIS — E739 Lactose intolerance, unspecified: Secondary | ICD-10-CM | POA: Insufficient documentation

## 2019-01-04 MED ORDER — IBUPROFEN 400 MG PO TABS
600.0000 mg | ORAL_TABLET | Freq: Once | ORAL | Status: AC
  Administered 2019-01-04: 600 mg via ORAL
  Filled 2019-01-04: qty 2
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
## 2019-04-15 ENCOUNTER — Encounter (HOSPITAL_COMMUNITY): Payer: Self-pay

## 2019-04-15 ENCOUNTER — Emergency Department (HOSPITAL_COMMUNITY)
Admission: EM | Admit: 2019-04-15 | Discharge: 2019-04-15 | Disposition: A | Discharge status: Home / Self Care | Payer: No Typology Code available for payment source | Attending: Emergency Medicine | Admitting: Emergency Medicine

## 2019-04-15 ENCOUNTER — Other Ambulatory Visit: Payer: Self-pay

## 2019-04-15 DIAGNOSIS — R1110 Vomiting, unspecified: Secondary | ICD-10-CM | POA: Insufficient documentation

## 2019-04-15 DIAGNOSIS — Z5321 Procedure and treatment not carried out due to patient leaving prior to being seen by health care provider: Secondary | ICD-10-CM | POA: Insufficient documentation

## 2019-04-15 LAB — URINALYSIS, ROUTINE W REFLEX MICROSCOPIC
Bilirubin Urine: NEGATIVE
Glucose, UA: NEGATIVE mg/dL
Hgb urine dipstick: NEGATIVE
Ketones, ur: NEGATIVE mg/dL
Leukocytes,Ua: NEGATIVE
Nitrite: NEGATIVE
Protein, ur: NEGATIVE mg/dL
Specific Gravity, Urine: 1.023 (ref 1.005–1.030)
pH: 6 (ref 5.0–8.0)

## 2019-04-15 LAB — COMPREHENSIVE METABOLIC PANEL
ALT: 19 U/L (ref 0–44)
AST: 22 U/L (ref 15–41)
Albumin: 4.5 g/dL (ref 3.5–5.0)
Alkaline Phosphatase: 60 U/L (ref 38–126)
Anion gap: 9 (ref 5–15)
BUN: 12 mg/dL (ref 6–20)
CO2: 28 mmol/L (ref 22–32)
Calcium: 9.2 mg/dL (ref 8.9–10.3)
Chloride: 100 mmol/L (ref 98–111)
Creatinine, Ser: 1.1 mg/dL (ref 0.61–1.24)
GFR calc Af Amer: >60 mL/min (ref >60)
GFR calc non Af Amer: >60 mL/min (ref >60)
Glucose, Bld: 79 mg/dL (ref 70–99)
Potassium: 4 mmol/L (ref 3.5–5.1)
Sodium: 137 mmol/L (ref 135–145)
Total Bilirubin: 1.5 mg/dL — ABNORMAL HIGH (ref 0.3–1.2)
Total Protein: 7.7 g/dL (ref 6.5–8.1)

## 2019-04-15 LAB — LIPASE, BLOOD: Lipase: 22 U/L (ref 11–51)

## 2019-04-15 LAB — CBC
HCT: 50.1 % (ref 39.0–52.0)
Hemoglobin: 16.6 g/dL (ref 13.0–17.0)
MCH: 30 pg (ref 26.0–34.0)
MCHC: 33.1 g/dL (ref 30.0–36.0)
MCV: 90.6 fL (ref 80.0–100.0)
Platelets: 236 10*3/uL (ref 150–400)
RBC: 5.53 MIL/uL (ref 4.22–5.81)
RDW: 11.9 % (ref 11.5–15.5)
WBC: 3 10*3/uL — ABNORMAL LOW (ref 4.0–10.5)
nRBC: 0 % (ref 0.0–0.2)

## 2019-04-15 MED ORDER — SODIUM CHLORIDE 0.9% FLUSH: 3.0000 mL | Freq: Once | INTRAVENOUS | Status: DC

## 2019-04-15 NOTE — ED Notes (Signed)
Called no answer

## 2019-04-15 NOTE — ED Triage Notes (Signed)
Pt reports for the past 2 days, he has been vomiting after he eats and abdominal cramps. Pt also has been having diarrhea

## 2019-06-15 ENCOUNTER — Other Ambulatory Visit: Payer: Self-pay

## 2019-06-15 DIAGNOSIS — Z20822 Contact with and (suspected) exposure to covid-19: Secondary | ICD-10-CM

## 2019-06-17 LAB — NOVEL CORONAVIRUS, NAA: SARS-CoV-2, NAA: NOT DETECTED

## 2019-06-21 ENCOUNTER — Telehealth: Payer: Self-pay | Admitting: *Deleted

## 2019-06-21 NOTE — Telephone Encounter (Signed)
Patient  called requesting results are faxed to his employer [employer]  fax  [phone]

## 2019-09-03 ENCOUNTER — Emergency Department (HOSPITAL_COMMUNITY)
Admission: EM | Admit: 2019-09-03 | Discharge: 2019-09-03 | Disposition: A | Discharge status: Home / Self Care | Payer: Self-pay | Attending: Emergency Medicine | Admitting: Emergency Medicine

## 2019-09-03 ENCOUNTER — Other Ambulatory Visit: Payer: Self-pay

## 2019-09-03 ENCOUNTER — Emergency Department (HOSPITAL_COMMUNITY)
Admission: EM | Admit: 2019-09-03 | Discharge: 2019-09-03 | Disposition: A | Discharge status: Home / Self Care | Payer: No Typology Code available for payment source | Attending: Emergency Medicine | Admitting: Emergency Medicine

## 2019-09-03 ENCOUNTER — Encounter (HOSPITAL_COMMUNITY): Payer: Self-pay | Admitting: *Deleted

## 2019-09-03 DIAGNOSIS — Z20822 Contact with and (suspected) exposure to covid-19: Secondary | ICD-10-CM | POA: Insufficient documentation

## 2019-09-03 DIAGNOSIS — B349 Viral infection, unspecified: Secondary | ICD-10-CM

## 2019-09-03 DIAGNOSIS — J029 Acute pharyngitis, unspecified: Secondary | ICD-10-CM | POA: Insufficient documentation

## 2019-09-03 DIAGNOSIS — Z87891 Personal history of nicotine dependence: Secondary | ICD-10-CM | POA: Insufficient documentation

## 2019-09-03 LAB — POC SARS CORONAVIRUS 2 AG -  ED: SARS Coronavirus 2 Ag: NEGATIVE

## 2019-09-03 MED ORDER — ACETAMINOPHEN 500 MG PO TABS
1000.0000 mg | ORAL_TABLET | Freq: Once | ORAL | Status: AC
  Administered 2019-09-03: 16:00:00 1000 mg via ORAL
  Filled 2019-09-03: qty 2

## 2019-09-03 MED ORDER — ALBUTEROL SULFATE HFA 108 (90 BASE) MCG/ACT IN AERS
2.0000 | INHALATION_SPRAY | Freq: Once | RESPIRATORY_TRACT | Status: AC
  Administered 2019-09-03: 17:00:00 2 via RESPIRATORY_TRACT
  Filled 2019-09-03 (×2): qty 6.7

## 2019-09-03 MED ORDER — DEXAMETHASONE 6 MG PO TABS: 6.0000 mg | ORAL_TABLET | Freq: Every day | ORAL | 0 refills | Status: DC

## 2019-09-03 MED ORDER — IBUPROFEN 800 MG PO TABS
800.0000 mg | ORAL_TABLET | Freq: Once | ORAL | Status: AC
  Administered 2019-09-03: 22:00:00 800 mg via ORAL
  Filled 2019-09-03: qty 1

## 2019-09-03 MED ORDER — DEXAMETHASONE 6 MG PO TABS: 6.0000 mg | ORAL_TABLET | Freq: Every day | ORAL | 0 refills | Status: AC

## 2019-09-03 NOTE — ED Provider Notes (Signed)
[facility] EMERGENCY DEPARTMENT Provider Note   CSN: [id number] Arrival date & time: 09/03/19  2057     History Chief Complaint  Patient presents with  . Neck Pain    Kevin Cortez is a 24 y.o. male with no significant past medical history who was seen here earlier today with complaints of generalized body aches, nonproductive cough, diarrhea which started today and family member with similar symptoms, also had fever on presentation earlier today returns for evaluation due to development sore throat which is worsened with swallowing.  He denies headache, vision changes, chest pain, shortness of breath and has had no nausea or vomiting.  He was screened for Covid at his prior visit.  At that visit he was also wheezing and was given an albuterol MDI which he states improve that symptom.  His point-of-care Covid was negative and his send out test is currently pending.  He had a low-grade fever when he presented here earlier which has resolved.  The history is provided by the patient.       History reviewed. No pertinent past medical history.  There are no problems to display for this patient.   Past Surgical History:  Procedure Laterality Date  . WISDOM TOOTH EXTRACTION         Family History  Problem Relation Age of Onset  . Hypertension Mother   . Hypertension Other     Social History   Tobacco Use  . Smoking status: Never Smoker  . Smokeless tobacco: Former Engineer, water Use Topics  . Alcohol use: Not Currently    Comment: occ  . Drug use: Not Currently    Types: Marijuana    Home Medications Prior to Admission medications   Medication Sig Start Date End Date Taking? Authorizing Provider  clindamycin (CLEOCIN) 150 MG capsule Take 1 capsule (150 mg total) by mouth every 6 (six) hours. 12/16/16   Elson Areas, PA-C  dexamethasone (DECADRON) 6 MG tablet Take 1 tablet (6 mg total) by mouth daily for 5 days. 09/03/19 09/08/19  Caccavale, Sophia, PA-C  diclofenac  (VOLTAREN) 50 MG EC tablet Take 1 tablet (50 mg total) by mouth 2 (two) times daily. 12/16/16   Elson Areas, PA-C  ibuprofen (ADVIL,MOTRIN) 200 MG tablet Take 200 mg by mouth every 6 (six) hours as needed.    [provider]    Allergies    Amoxicillin and Lactose intolerance (gi)  Review of Systems   Review of Systems  Constitutional: Positive for fever. Negative for chills.  HENT: Positive for congestion and sore throat. Negative for ear pain, rhinorrhea, sinus pressure, trouble swallowing and voice change.   Eyes: Negative for discharge.  Respiratory: Positive for cough and wheezing. Negative for shortness of breath and stridor.   Cardiovascular: Negative for chest pain.  Gastrointestinal: Positive for diarrhea. Negative for abdominal pain, nausea and vomiting.  Genitourinary: Negative.     Physical Exam Updated Vital Signs BP 126/82 (BP Location: Right Arm)   Pulse 99   Temp 99.1 F (37.3 C) (Oral)   Resp 14   SpO2 97%   Physical Exam Constitutional:      Appearance: He is well-developed.  HENT:     Head: Normocephalic and atraumatic.     Right Ear: Tympanic membrane and ear canal normal.     Left Ear: Tympanic membrane and ear canal normal.     Nose: Mucosal edema and congestion present. No rhinorrhea.     Comments: Minimal posterior erythema  along soft palate.  There is no tonsillar hypertrophy, no exudate.  Uvula midline.    Mouth/Throat:     Mouth: Mucous membranes are moist.     Pharynx: Uvula midline. No oropharyngeal exudate or posterior oropharyngeal erythema.     Tonsils: No tonsillar abscesses.  Eyes:     Conjunctiva/sclera: Conjunctivae normal.  Cardiovascular:     Rate and Rhythm: Normal rate.     Heart sounds: Normal heart sounds.  Pulmonary:     Effort: Pulmonary effort is normal. No respiratory distress.     Breath sounds: No wheezing or rales.     Comments: Lungs clear without wheezing. Musculoskeletal:        General: Normal range of  motion.     Cervical back: No rigidity.  Lymphadenopathy:     Cervical: No cervical adenopathy.  Skin:    General: Skin is warm and dry.     Findings: No rash.  Neurological:     General: No focal deficit present.     Mental Status: He is alert and oriented to person, place, and time.     ED Results / Procedures / Treatments   Labs (all labs ordered are listed, but only abnormal results are displayed) Labs Reviewed - No data to display  EKG None  Radiology No results found.  Procedures Procedures (including critical care time)  Medications Ordered in ED Medications  ibuprofen (ADVIL) tablet 800 mg (has no administration in time range)    ED Course  I have reviewed the triage vital signs and the nursing notes.  Pertinent labs & imaging results that were available during my care of the patient were reviewed by me and considered in my medical decision making (see chart for details).    MDM Rules/Calculators/A&P                      Patient with an essentially unchanged exam from prior visit today per review of chart, with the exception that he is not wheezing at this time with clear lungs bilaterally.  Patient was given reassurance that I suspect he has a URI versus Covid, and I stressed to him that his confirmatory Covid test is pending and he should maintain home isolation until this test has resulted and is negative.  He has no exam findings to suggest his sore throat is from strep, exam and history appears viral in nature.  He was encouraged to continue using the albuterol given previously if his wheezing returns.  He was also prescribed Decadron at his previous visit and was encouraged to complete this course also. Final Clinical Impression(s) / ED Diagnoses Final diagnoses:  Viral illness  Suspected COVID-19 virus infection    Rx / DC Orders ED Discharge Orders    None       Landis Martins 09/03/19 2241    Noemi Chapel, MD 09/04/19 1620

## 2019-09-03 NOTE — ED Notes (Signed)
Here earlier for malaise  Reports neck hurt while here but did not tell anyone  Here due to neck pain   Has not taken any OTC meds for relief  Covid testing earlier which is resulted negative

## 2019-09-03 NOTE — Discharge Instructions (Signed)
You likely have a viral illness.  This should be treated symptomatically. Take the Decadron as prescribed.  Take the entire course. Use your inhaler every 2-4 hours for the next 2 days.  After that, use as needed for shortness of breath, coughing fits, chest tightness. Use Tylenol or ibuprofen as needed for fevers or body aches. Make sure you stay well-hydrated with water. Wash your hands frequently to prevent spread of infection. You were tested for coronavirus.  Results should return in about 2 days.  If positive, you will recieve a phone call.  If negative, you will not.  Either way, you may check online on MyChart. If positive, you will need to quarantine from 10 days from symptom onset.  You may end quarantine if you are fever free for 24 hours and your symptoms are improving. Return to the emergency room if you develop chest pain, difficulty breathing, or any new or worsening symptoms.

## 2019-09-03 NOTE — ED Triage Notes (Signed)
Pt with generalized body aches for past 2 days, denies covid exposure.  100.8 fever in triage.  denies N/V but + diarrhea x 2 today

## 2019-09-03 NOTE — ED Triage Notes (Signed)
Pt seen here earlier today and went home.  Pt now c/o neck pain.

## 2019-09-03 NOTE — ED Provider Notes (Signed)
[facility] EMERGENCY DEPARTMENT Provider Note   CSN: [id number] Arrival date & time: 09/03/19  1404     History Chief Complaint  Patient presents with  . Generalized Body Aches    Kevin Cortez is a 24 y.o. male presenting for evaluation of generalized body aches, cough, and diarrhea.  Patient states the past 2 days, he has not been feeling well.  He reports a mild, nonproductive cough.  He is generalized body aches.  Diarrhea started today.  No blood in his stool.  He states one of his family members is sick.  Neither he or she have been tested for Covid.  He denies known exposure.  He denies known fever, though was febrile on arrival to the ED today.  He denies headache, vision changes, loss of taste or smell, chest pain, shortness of breath, nausea, vomiting, abdominal pain.  He has no other medical problems, takes no medications daily.  No history of asthma or COPD.  He does not smoke cigarettes.  HPI     History reviewed. No pertinent past medical history.  There are no problems to display for this patient.   Past Surgical History:  Procedure Laterality Date  . WISDOM TOOTH EXTRACTION         Family History  Problem Relation Age of Onset  . Hypertension Mother   . Hypertension Other     Social History   Tobacco Use  . Smoking status: Never Smoker  . Smokeless tobacco: Former Engineer, water Use Topics  . Alcohol use: Not Currently    Comment: occ  . Drug use: Not Currently    Types: Marijuana    Home Medications Prior to Admission medications   Medication Sig Start Date End Date Taking? Authorizing Provider  clindamycin (CLEOCIN) 150 MG capsule Take 1 capsule (150 mg total) by mouth every 6 (six) hours. 12/16/16   Elson Areas, PA-C  dexamethasone (DECADRON) 6 MG tablet Take 1 tablet (6 mg total) by mouth daily for 5 days. 09/03/19 09/08/19  Yvetta Drotar, PA-C  diclofenac (VOLTAREN) 50 MG EC tablet Take 1 tablet (50 mg total) by mouth 2 (two) times  daily. 12/16/16   Elson Areas, PA-C  ibuprofen (ADVIL,MOTRIN) 200 MG tablet Take 200 mg by mouth every 6 (six) hours as needed.    [provider]    Allergies    Amoxicillin and Lactose intolerance (gi)  Review of Systems   Review of Systems  Constitutional: Positive for fever.  Respiratory: Positive for cough.   Gastrointestinal: Positive for diarrhea.  Musculoskeletal: Positive for myalgias.  All other systems reviewed and are negative.   Physical Exam Updated Vital Signs BP 113/69 (BP Location: Right Arm)   Pulse 99   Temp 100.1 F (37.8 C) (Oral)   Resp 18   Ht 5' 10.5" (1.791 m)   Wt 68 kg   SpO2 97%   BMI 21.22 kg/m   Physical Exam Vitals and nursing note reviewed.  Constitutional:      General: He is not in acute distress.    Appearance: He is well-developed.     Comments: Appears nontoxic  HENT:     Head: Normocephalic and atraumatic.  Eyes:     Conjunctiva/sclera: Conjunctivae normal.     Pupils: Pupils are equal, round, and reactive to light.  Cardiovascular:     Rate and Rhythm: Regular rhythm. Tachycardia present.     Pulses: Normal pulses.     Comments: Mildly tachycardic around 110  Pulmonary:     Effort: Pulmonary effort is normal. No respiratory distress.     Breath sounds: Wheezing present.     Comments: Scattered expiratory wheezing in all fields. speaking in full sentences.  Sats stable on room air.  No signs of respiratory distress. Abdominal:     General: There is no distension.     Palpations: Abdomen is soft. There is no mass.     Tenderness: There is no abdominal tenderness. There is no guarding or rebound.  Musculoskeletal:        General: Normal range of motion.     Cervical back: Normal range of motion and neck supple.  Skin:    General: Skin is warm and dry.     Capillary Refill: Capillary refill takes less than 2 seconds.  Neurological:     Mental Status: He is alert and oriented to person, place, and time.     ED  Results / Procedures / Treatments   Labs (all labs ordered are listed, but only abnormal results are displayed) Labs Reviewed  SARS CORONAVIRUS 2 (TAT 6-24 HRS)  POC SARS CORONAVIRUS 2 AG -  ED    EKG None  Radiology No results found.  Procedures Procedures (including critical care time)  Medications Ordered in ED Medications  acetaminophen (TYLENOL) tablet 1,000 mg (1,000 mg Oral Given 09/03/19 1542)  albuterol (VENTOLIN HFA) 108 (90 Base) MCG/ACT inhaler 2 puff (2 puffs Inhalation Given 09/03/19 1631)    ED Course  I have reviewed the triage vital signs and the nursing notes.  Pertinent labs & imaging results that were available during my care of the patient were reviewed by me and considered in my medical decision making (see chart for details).    MDM Rules/Calculators/A&P                      Patient presenting for evaluation of viral-like symptoms.  Physical exam reassuring, he appears nontoxic.  He is mildly tachycardic, but this is likely due to his fever.  He has scattered expiratory wheezing.  As such, consider bronchitis.  Consider Covid or other viral illness.  Doubt bacterial infection such as pneumonia.  Will treat symptomatically and reassess.  On reassessment, patient reports decreased cough and he feels he is breathing better.  Heart rate improved with fever control.  Rapid Covid negative, will send PCR.  Discussed likely Covid diagnosis or other viral illness.  Discussed symptomatic treatment and prompt return to the ER with any worsening respiratory status.  At this time, patient appears safe for discharge.  Return precautions given.  Patient states he understands and agrees to plan.  Final Clinical Impression(s) / ED Diagnoses Final diagnoses:  Viral illness  Suspected COVID-19 virus infection    Rx / DC Orders ED Discharge Orders         Ordered    dexamethasone (DECADRON) 6 MG tablet  Daily,   Status:  Discontinued     09/03/19 1628    dexamethasone  (DECADRON) 6 MG tablet  Daily     09/03/19 1630           Sariyah Corcino, [place], PA-C 09/03/19 1713    Noemi Chapel, MD 09/03/19 2048

## 2019-09-03 NOTE — Discharge Instructions (Signed)
Refer to the instructions given at your previous visit here tonight.   Your exam is reassuring.  You have symptoms suggestive of a viral illness, possibly Covid 19, your confirmatory test should be back by tomorrow.  Continue using the inhaler given earlier and get the steroid medicine started, especially if your wheezing persists or your covid is positive.

## 2019-09-04 LAB — SARS CORONAVIRUS 2 (TAT 6-24 HRS): SARS Coronavirus 2: NEGATIVE

## 2020-04-30 ENCOUNTER — Encounter (HOSPITAL_COMMUNITY): Payer: Self-pay | Admitting: Emergency Medicine

## 2020-04-30 ENCOUNTER — Other Ambulatory Visit: Payer: Self-pay

## 2020-04-30 ENCOUNTER — Emergency Department (HOSPITAL_COMMUNITY)
Admission: EM | Admit: 2020-04-30 | Discharge: 2020-04-30 | Disposition: A | Discharge status: Home / Self Care | Payer: Self-pay | Attending: Emergency Medicine | Admitting: Emergency Medicine

## 2020-04-30 DIAGNOSIS — Y92481 Parking lot as the place of occurrence of the external cause: Secondary | ICD-10-CM | POA: Diagnosis not present

## 2020-04-30 DIAGNOSIS — Z041 Encounter for examination and observation following transport accident: Secondary | ICD-10-CM | POA: Insufficient documentation

## 2020-04-30 DIAGNOSIS — T1490XA Injury, unspecified, initial encounter: Secondary | ICD-10-CM | POA: Diagnosis present
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
## 2020-10-02 ENCOUNTER — Encounter (HOSPITAL_COMMUNITY): Payer: Self-pay | Admitting: *Deleted

## 2020-10-02 ENCOUNTER — Other Ambulatory Visit: Payer: Self-pay

## 2020-10-02 ENCOUNTER — Emergency Department (HOSPITAL_COMMUNITY)
Admission: EM | Admit: 2020-10-02 | Discharge: 2020-10-02 | Disposition: A | Discharge status: Home / Self Care | Payer: Self-pay | Attending: Emergency Medicine | Admitting: Emergency Medicine

## 2020-10-02 DIAGNOSIS — Y92410 Unspecified street and highway as the place of occurrence of the external cause: Secondary | ICD-10-CM | POA: Insufficient documentation

## 2020-10-02 DIAGNOSIS — S50812A Abrasion of left forearm, initial encounter: Secondary | ICD-10-CM

## 2020-10-02 DIAGNOSIS — S59911A Unspecified injury of right forearm, initial encounter: Secondary | ICD-10-CM | POA: Insufficient documentation

## 2020-10-02 DIAGNOSIS — W2211XA Striking against or struck by driver side automobile airbag, initial encounter: Secondary | ICD-10-CM | POA: Insufficient documentation
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
## 2020-10-02 NOTE — Discharge Instructions (Addendum)
Ice to areas of soreness.  Ibuprofen for pain
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
## 2020-10-28 ENCOUNTER — Emergency Department (HOSPITAL_COMMUNITY): Payer: Self-pay

## 2020-10-28 ENCOUNTER — Encounter (HOSPITAL_COMMUNITY): Payer: Self-pay | Admitting: Emergency Medicine

## 2020-10-28 ENCOUNTER — Emergency Department (HOSPITAL_COMMUNITY)
Admission: EM | Admit: 2020-10-28 | Discharge: 2020-10-28 | Disposition: A | Discharge status: Home / Self Care | Payer: Self-pay | Attending: Emergency Medicine | Admitting: Emergency Medicine

## 2020-10-28 ENCOUNTER — Other Ambulatory Visit: Payer: Self-pay

## 2020-10-28 DIAGNOSIS — Y990 Civilian activity done for income or pay: Secondary | ICD-10-CM | POA: Insufficient documentation

## 2020-10-28 DIAGNOSIS — S62514A Nondisplaced fracture of proximal phalanx of right thumb, initial encounter for closed fracture: Secondary | ICD-10-CM | POA: Insufficient documentation

## 2020-10-28 DIAGNOSIS — Y9289 Other specified places as the place of occurrence of the external cause: Secondary | ICD-10-CM | POA: Insufficient documentation

## 2020-10-28 DIAGNOSIS — Z87891 Personal history of nicotine dependence: Secondary | ICD-10-CM | POA: Insufficient documentation

## 2020-10-28 DIAGNOSIS — W228XXA Striking against or struck by other objects, initial encounter: Secondary | ICD-10-CM | POA: Insufficient documentation
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
## 2020-10-28 NOTE — ED Provider Notes (Signed)
[facility] EMERGENCY DEPARTMENT Provider Note   CSN: [id number] Arrival date & time: 10/28/20  1005     History Chief Complaint  Patient presents with  . Finger Injury    Right Thumb    Kevin Cortez is a 25 y.o. male.  The history is provided by the patient. No language interpreter was used.  Hand Pain This is a new problem. The current episode started yesterday. The problem occurs constantly. The problem has been gradually worsening. Nothing aggravates the symptoms. Nothing relieves the symptoms. He has tried nothing for the symptoms. The treatment provided no relief.   Pt reports he hit his thumb at work.  Pt complains of pain and swelling      History reviewed. No pertinent past medical history.  There are no problems to display for this patient.   Past Surgical History:  Procedure Laterality Date  . WISDOM TOOTH EXTRACTION         Family History  Problem Relation Age of Onset  . Hypertension Mother   . Hypertension Other     Social History   Tobacco Use  . Smoking status: Never Smoker  . Smokeless tobacco: Former Engineer, water Use Topics  . Alcohol use: Not Currently    Comment: occ  . Drug use: Not Currently    Types: Marijuana    Home Medications Prior to Admission medications   Medication Sig Start Date End Date Taking? Authorizing Provider  clindamycin (CLEOCIN) 150 MG capsule Take 1 capsule (150 mg total) by mouth every 6 (six) hours. 12/16/16   Elson Areas, PA-C  diclofenac (VOLTAREN) 50 MG EC tablet Take 1 tablet (50 mg total) by mouth 2 (two) times daily. 12/16/16   Elson Areas, PA-C  ibuprofen (ADVIL,MOTRIN) 200 MG tablet Take 200 mg by mouth every 6 (six) hours as needed.    [provider]    Allergies    Amoxicillin and Lactose intolerance (gi)  Review of Systems   Review of Systems  All other systems reviewed and are negative.   Physical Exam Updated Vital Signs BP 137/76 (BP Location: Left Arm)   Pulse 72    Temp 98.5 F (36.9 C) (Oral)   Resp 17   Ht 5\' 10"  (1.778 m)   Wt 71.7 kg   SpO2 100%   BMI 22.67 kg/m   Physical Exam Vitals and nursing note reviewed.  Constitutional:      Appearance: He is well-developed.  HENT:     Head: Normocephalic and atraumatic.  Eyes:     Conjunctiva/sclera: Conjunctivae normal.  Cardiovascular:     Rate and Rhythm: Normal rate.  Pulmonary:     Effort: Pulmonary effort is normal.  Musculoskeletal:        General: Swelling and tenderness present.     Comments: Swollen at base of phalanx and metacarpal joint  From with pain, nv and ns intact   Skin:    General: Skin is warm and dry.  Neurological:     General: No focal deficit present.     Mental Status: He is alert.  Psychiatric:        Mood and Affect: Mood normal.     ED Results / Procedures / Treatments   Labs (all labs ordered are listed, but only abnormal results are displayed) Labs Reviewed - No data to display  EKG None  Radiology DG Finger Thumb Right  Result Date: 10/28/2020 CLINICAL DATA:  Right thumb pain after trauma. EXAM: RIGHT  THUMB 2+V COMPARISON:  None. FINDINGS: There is a subtle curvilinear density just proximal to the sesamoid bones adjacent to the distal aspect of the first metacarpal. This is only seen on one view. There is a well corticated calcification at the interphalangeal joint, likely a sesamoid or sequela of remote trauma. No other abnormalities are identified. IMPRESSION: 1. There is a subtle curvilinear density just proximal to the sesamoid bones adjacent to the distal aspect of the first metacarpal. This finding is only seen on one view and is nonspecific. A tiny avulsed fracture fragment is not excluded. Recommend clinical correlation for point tenderness in this region. No other acute fractures noted. Electronically Signed   By: Gerome Sam III M.D   On: 10/28/2020 11:17    Procedures Procedures   Medications Ordered in ED Medications - No data to  display  ED Course  I have reviewed the triage vital signs and the nursing notes.  Pertinent labs & imaging results that were available during my care of the patient were reviewed by me and considered in my medical decision making (see chart for details).    MDM Rules/Calculators/A&P                          MDM:  Pt placed in a splint and referred to Orthopaedist  Final Clinical Impression(s) / ED Diagnoses Final diagnoses:  Closed nondisplaced fracture of proximal phalanx of right thumb, initial encounter    Rx / DC Orders ED Discharge Orders         Ordered    Wrist splint       Comments: Thumb spica   10/28/20 1149        An After Visit Summary was printed and given to the patient.    Elson Areas, Cordelia Poche 10/28/20 1209    Vanetta Mulders, MD 10/29/20 [phone]

## 2020-10-28 NOTE — Discharge Instructions (Signed)
Return if any problems.  Tylenol for pain Follow up with Dr. Romeo Apple

## 2020-10-30 ENCOUNTER — Encounter: Payer: Self-pay | Admitting: Orthopedic Surgery

## 2020-10-30 ENCOUNTER — Ambulatory Visit: Payer: Self-pay | Admitting: Orthopedic Surgery

## 2020-10-30 ENCOUNTER — Other Ambulatory Visit: Payer: Self-pay

## 2020-10-30 VITALS — BP 142/91 | HR 63 | Ht 70.5 in | Wt 150.0 lb

## 2020-10-30 DIAGNOSIS — S63641A Sprain of metacarpophalangeal joint of right thumb, initial encounter: Secondary | ICD-10-CM

## 2020-10-30 NOTE — Patient Instructions (Addendum)
Ibuprofen 800 mg 3 x a day   Wear brace until 29 th   Work ok
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
## 2020-11-13 ENCOUNTER — Encounter: Payer: Self-pay | Admitting: Orthopedic Surgery

## 2020-11-13 ENCOUNTER — Ambulatory Visit (INDEPENDENT_AMBULATORY_CARE_PROVIDER_SITE_OTHER): Payer: Self-pay | Admitting: Orthopedic Surgery

## 2020-11-13 ENCOUNTER — Other Ambulatory Visit: Payer: Self-pay

## 2020-11-13 VITALS — BP 144/87 | HR 59 | Ht 70.0 in | Wt 150.0 lb

## 2020-11-13 DIAGNOSIS — S63641D Sprain of metacarpophalangeal joint of right thumb, subsequent encounter: Secondary | ICD-10-CM
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
## 2020-12-11 ENCOUNTER — Ambulatory Visit (INDEPENDENT_AMBULATORY_CARE_PROVIDER_SITE_OTHER): Payer: Self-pay | Admitting: Orthopedic Surgery

## 2020-12-11 ENCOUNTER — Other Ambulatory Visit: Payer: Self-pay

## 2020-12-11 ENCOUNTER — Encounter: Payer: Self-pay | Admitting: Orthopedic Surgery

## 2020-12-11 VITALS — BP 136/80 | HR 86 | Ht 70.0 in | Wt 150.0 lb

## 2020-12-11 DIAGNOSIS — S63641D Sprain of metacarpophalangeal joint of right thumb, subsequent encounter: Secondary | ICD-10-CM

## 2020-12-11 NOTE — Progress Notes (Signed)
Chief Complaint  Patient presents with  . Hand Pain    Right/ thumb still swollen    Right handed right thumb sprain   It feels funny  Re examine   Right thumb :  Slightly swollen  Ligaments are stable Pinch is normal  He has a little loss of flexion otherwise everything seems normal   Encounter Diagnosis  Name Primary?  . Sprain of metacarpophalangeal (MCP) joint of right thumb, subsequent encounter Yes    Discharge

## 2021-04-05 ENCOUNTER — Encounter (HOSPITAL_COMMUNITY): Payer: Self-pay | Admitting: *Deleted

## 2021-04-05 ENCOUNTER — Emergency Department (HOSPITAL_COMMUNITY)
Admission: EM | Admit: 2021-04-05 | Discharge: 2021-04-05 | Disposition: A | Discharge status: Home / Self Care | Payer: Self-pay | Attending: Emergency Medicine | Admitting: Emergency Medicine

## 2021-04-05 ENCOUNTER — Other Ambulatory Visit: Payer: Self-pay

## 2021-04-05 DIAGNOSIS — F17210 Nicotine dependence, cigarettes, uncomplicated: Secondary | ICD-10-CM | POA: Insufficient documentation

## 2021-04-05 DIAGNOSIS — M67431 Ganglion, right wrist: Secondary | ICD-10-CM | POA: Insufficient documentation

## 2021-04-05 NOTE — ED Provider Notes (Signed)
[facility] EMERGENCY DEPARTMENT Provider Note   CSN: [id number] Arrival date & time: 04/05/21  1324     History Chief Complaint  Patient presents with   Wrist Problem    Kevin Cortez is a 25 y.o. male.  Pt presents to the ED today with a right wrist problem.  Pt said he has noticed a knot on his wrist for the past week.  It causes no pain and does not cause any difficulty with mobility.  He wanted to make sure it was not anything serious.      History reviewed. No pertinent past medical history.  There are no problems to display for this patient.   Past Surgical History:  Procedure Laterality Date   WISDOM TOOTH EXTRACTION         Family History  Problem Relation Age of Onset   Hypertension Mother    Hypertension Other     Social History   Tobacco Use   Smoking status: Some Days    Types: Cigarettes   Smokeless tobacco: Former  Building services engineer Use: Never used  Substance Use Topics   Alcohol use: Yes    Comment: occ   Drug use: Not Currently    Types: Marijuana    Home Medications Prior to Admission medications   Not on File    Allergies    Amoxicillin and Lactose intolerance (gi)  Review of Systems   Review of Systems  Musculoskeletal:        Knot on right wrist   Physical Exam Updated Vital Signs BP (!) 140/96 (BP Location: Left Arm)   Pulse 68   Temp 98.1 F (36.7 C) (Oral)   Resp 16   Ht 5\' 10"  (1.778 m)   Wt 65.8 kg   SpO2 100%   BMI 20.81 kg/m   Physical Exam Vitals and nursing note reviewed.  Constitutional:      Appearance: Normal appearance.  HENT:     Head: Normocephalic and atraumatic.     Right Ear: External ear normal.     Left Ear: External ear normal.     Nose: Nose normal.     Mouth/Throat:     Mouth: Mucous membranes are moist.     Pharynx: Oropharynx is clear.  Eyes:     Extraocular Movements: Extraocular movements intact.     Conjunctiva/sclera: Conjunctivae normal.     Pupils: Pupils are equal,  round, and reactive to light.  Cardiovascular:     Rate and Rhythm: Normal rate and regular rhythm.     Pulses: Normal pulses.     Heart sounds: Normal heart sounds.  Pulmonary:     Effort: Pulmonary effort is normal.     Breath sounds: Normal breath sounds.  Abdominal:     General: Abdomen is flat. Bowel sounds are normal.     Palpations: Abdomen is soft.  Musculoskeletal:     Cervical back: Normal range of motion and neck supple.     Comments: Ganglion cyst dorsal right wrist.  No pain.  No redness.  No decreased rom.  Skin:    General: Skin is warm.     Capillary Refill: Capillary refill takes less than 2 seconds.  Neurological:     General: No focal deficit present.     Mental Status: He is alert and oriented to person, place, and time.  Psychiatric:        Mood and Affect: Mood normal.        Behavior:  Behavior normal.    ED Results / Procedures / Treatments   Labs (all labs ordered are listed, but only abnormal results are displayed) Labs Reviewed - No data to display  EKG None  Radiology No results found.  Procedures Procedures   Medications Ordered in ED Medications - No data to display  ED Course  I have reviewed the triage vital signs and the nursing notes.  Pertinent labs & imaging results that were available during my care of the patient were reviewed by me and considered in my medical decision making (see chart for details).    MDM Rules/Calculators/A&P                           Pt told to f/u with hand if the ganglion cyst bothers him.  Return if worse.  Final Clinical Impression(s) / ED Diagnoses Final diagnoses:  Ganglion cyst of wrist, right    Rx / DC Orders ED Discharge Orders     None        Jacalyn Lefevre, MD 04/05/21 [phone]

## 2021-04-05 NOTE — ED Triage Notes (Signed)
Pt c/o "knot" to right wrist since last week. Denies pain or mobility issues.

## 2021-05-25 ENCOUNTER — Other Ambulatory Visit: Payer: Self-pay

## 2021-05-25 ENCOUNTER — Emergency Department (HOSPITAL_COMMUNITY)
Admission: EM | Admit: 2021-05-25 | Discharge: 2021-05-25 | Disposition: A | Discharge status: Home / Self Care | Payer: Self-pay | Attending: Emergency Medicine | Admitting: Emergency Medicine

## 2021-05-25 ENCOUNTER — Encounter (HOSPITAL_COMMUNITY): Payer: Self-pay

## 2021-05-25 DIAGNOSIS — F17210 Nicotine dependence, cigarettes, uncomplicated: Secondary | ICD-10-CM | POA: Insufficient documentation

## 2021-05-25 DIAGNOSIS — Z20822 Contact with and (suspected) exposure to covid-19: Secondary | ICD-10-CM | POA: Insufficient documentation

## 2021-05-25 DIAGNOSIS — J101 Influenza due to other identified influenza virus with other respiratory manifestations: Secondary | ICD-10-CM | POA: Insufficient documentation

## 2021-05-25 LAB — RESP PANEL BY RT-PCR (FLU A&B, COVID) ARPGX2
Influenza A by PCR: POSITIVE — AB
Influenza B by PCR: NEGATIVE
SARS Coronavirus 2 by RT PCR: NEGATIVE

## 2021-05-25 MED ORDER — ONDANSETRON 4 MG PO TBDP: 4.0000 mg | ORAL_TABLET | Freq: Three times a day (TID) | ORAL | 0 refills | Status: AC | PRN

## 2021-05-25 NOTE — ED Triage Notes (Signed)
Pt presents to ED with complaints of nasal congestion, vomiting, body aches, chills started 2 days ago.

## 2021-05-25 NOTE — Discharge Instructions (Addendum)
You were seen in the emergency department today for nasal congestion and body aches.  As we discussed your influenza A test is positive.  This is a viral illness very common at this time of year, and we normally treat with over-the-counter medications.  Symptoms can last for up to a week.  You can take ibuprofen or Tylenol for pain or fever, and I recommend alternating between the 2.  Make sure that you are drinking lots of fluids and getting plenty of rest.  Continue to monitor how you are doing, and return to the emergency department for new or worsening symptoms such as chest pain, difficulty breathing not related to coughing, fever despite medication, or persistent vomiting or diarrhea.  It was a pleasure taking care of you today and I hope you begin to feel better soon!

## 2021-05-25 NOTE — ED Provider Notes (Signed)
[facility] EMERGENCY DEPARTMENT Provider Note   CSN: [id number] Arrival date & time: 05/25/21  0315     History Chief Complaint  Patient presents with   Nasal Congestion    Kevin Cortez is a 25 y.o. male presents emergency department with nasal congestion, nausea, vomiting, body aches for 2 days.  Patient has been trying over-the-counter medications with minimal relief.  States he is had chills, but no documented fever.  He had approximately 4 episodes of nonbloody nonbilious emesis, and about 3 episodes of nonbloody diarrhea last night.  Overall patient states that his symptoms are much improved today, but continues to have decreased appetite and body aches.  HPI     History reviewed. No pertinent past medical history.  There are no problems to display for this patient.   Past Surgical History:  Procedure Laterality Date   WISDOM TOOTH EXTRACTION         Family History  Problem Relation Age of Onset   Hypertension Mother    Hypertension Other     Social History   Tobacco Use   Smoking status: Some Days    Types: Cigarettes   Smokeless tobacco: Former  Building services engineer Use: Never used  Substance Use Topics   Alcohol use: Yes    Comment: occ   Drug use: Not Currently    Types: Marijuana    Home Medications Prior to Admission medications   Medication Sig Start Date End Date Taking? Authorizing Provider  ondansetron (ZOFRAN ODT) 4 MG disintegrating tablet Take 1 tablet (4 mg total) by mouth every 8 (eight) hours as needed for nausea or vomiting. 05/25/21  Yes Kentrel Clevenger T, PA-C    Allergies    Amoxicillin and Lactose intolerance (gi)  Review of Systems   Review of Systems  Constitutional:  Positive for chills. Negative for fever.  HENT:  Positive for congestion. Negative for sore throat and trouble swallowing.   Respiratory:  Negative for cough and shortness of breath.   Cardiovascular:  Negative for chest pain.  Gastrointestinal:   Positive for diarrhea, nausea and vomiting. Negative for abdominal pain, blood in stool and constipation.  All other systems reviewed and are negative.  Physical Exam Updated Vital Signs BP (!) 135/102 (BP Location: Right Arm)   Pulse 72   Temp 99 F (37.2 C) (Oral)   Resp 20   Ht 5\' 10"  (1.778 m)   Wt 64.9 kg   SpO2 99%   BMI 20.52 kg/m   Physical Exam Vitals and nursing note reviewed.  Constitutional:      Appearance: Normal appearance.  HENT:     Head: Normocephalic and atraumatic.  Eyes:     Conjunctiva/sclera: Conjunctivae normal.  Cardiovascular:     Rate and Rhythm: Normal rate and regular rhythm.  Pulmonary:     Effort: Pulmonary effort is normal. No respiratory distress.     Breath sounds: Normal breath sounds.  Abdominal:     General: There is no distension.     Palpations: Abdomen is soft.     Tenderness: There is no abdominal tenderness.  Skin:    General: Skin is warm and dry.  Neurological:     General: No focal deficit present.     Mental Status: He is alert.    ED Results / Procedures / Treatments   Labs (all labs ordered are listed, but only abnormal results are displayed) Labs Reviewed  RESP PANEL BY RT-PCR (FLU A&B, COVID) ARPGX2 - Abnormal;  Notable for the following components:      Result Value   Influenza A by PCR POSITIVE (*)    All other components within normal limits    EKG None  Radiology No results found.  Procedures Procedures   Medications Ordered in ED Medications - No data to display  ED Course  I have reviewed the triage vital signs and the nursing notes.  Pertinent labs & imaging results that were available during my care of the patient were reviewed by me and considered in my medical decision making (see chart for details).    MDM Rules/Calculators/A&P                           Patient is otherwise healthy 25 year old male presents the emergency department with flulike symptoms for 2 days.  On exam patient is  afebrile, not tachycardic, not hypoxic, no acute distress.  Lung sounds are clear in all fields.  Abdomen is soft, nontender nondistended, and overall nonsurgical.  Influenza A test is positive.  Patient is hemodynamically stable and not requiring admission or inpatient treatment for symptoms at this time.  Plan to treat symptomatically.  Discussed reasons to return the emergency department.  Patient agreeable to plan.  Final Clinical Impression(s) / ED Diagnoses Final diagnoses:  Influenza A    Rx / DC Orders ED Discharge Orders          Ordered    ondansetron (ZOFRAN ODT) 4 MG disintegrating tablet  Every 8 hours PRN        05/25/21 1208           Portions of this report may have been transcribed using voice recognition software. Every effort was made to ensure accuracy; however, inadvertent computerized transcription errors may be present.    Jeanella Flattery 05/25/21 1506    Jacalyn Lefevre, MD 05/28/21 [phone]

## 2021-08-13 ENCOUNTER — Emergency Department (HOSPITAL_COMMUNITY)
Admission: EM | Admit: 2021-08-13 | Discharge: 2021-08-13 | Disposition: A | Discharge status: Home / Self Care | Payer: Self-pay | Attending: Emergency Medicine | Admitting: Emergency Medicine

## 2021-08-13 ENCOUNTER — Encounter (HOSPITAL_COMMUNITY): Payer: Self-pay

## 2021-08-13 ENCOUNTER — Emergency Department (HOSPITAL_COMMUNITY): Payer: Self-pay

## 2021-08-13 DIAGNOSIS — R0789 Other chest pain: Secondary | ICD-10-CM | POA: Insufficient documentation

## 2021-08-13 MED ORDER — IBUPROFEN 800 MG PO TABS: 800.0000 mg | ORAL_TABLET | Freq: Three times a day (TID) | ORAL | 0 refills | Status: DC

## 2021-08-13 MED ORDER — KETOROLAC TROMETHAMINE 60 MG/2ML IM SOLN
30.0000 mg | Freq: Once | INTRAMUSCULAR | Status: AC
  Administered 2021-08-13: 30 mg via INTRAMUSCULAR
  Filled 2021-08-13: qty 2

## 2021-08-13 NOTE — ED Triage Notes (Addendum)
Patient reports right sided chest pain that started last night after work. Patient is unsure if its related to his heart or if he pulled a muscle at work. Patient feels pain mostly when moving or taking a deep breath. Hx of heart murmur.

## 2021-08-14 NOTE — ED Provider Notes (Signed)
[facility] EMERGENCY DEPARTMENT Provider Note   CSN: [id number] Arrival date & time: 08/13/21  0025     History  Chief Complaint  Patient presents with   Chest Pain    Kevin Cortez is a 26 y.o. male.  Patient states that he started having right-sided chest pain yesterday after work.  Hurts when he moves his arm a certain way.  Hurts when he pushes on certain way.  Hurts when he takes a big deep breath.  No shortness of breath or cough.  No fevers.  No other associated symptoms.  States that he lifts heavy things at work and wonders if maybe he pulled something.   Chest Pain     Home Medications Prior to Admission medications   Medication Sig Start Date End Date Taking? Authorizing Provider  ibuprofen (ADVIL) 800 MG tablet Take 1 tablet (800 mg total) by mouth 3 (three) times daily. 08/13/21  Yes Kecia Swoboda, Barbara Cower, MD  ondansetron (ZOFRAN ODT) 4 MG disintegrating tablet Take 1 tablet (4 mg total) by mouth every 8 (eight) hours as needed for nausea or vomiting. 05/25/21   Roemhildt, Lorin T, PA-C      Allergies    Amoxicillin and Lactose intolerance (gi)    Review of Systems   Review of Systems  Cardiovascular:  Positive for chest pain.   Physical Exam Updated Vital Signs BP 134/90    Pulse 76    Temp 97.8 F (36.6 C) (Oral)    Resp 15    Ht 5\' 10"  (1.778 m)    Wt 65.8 kg    SpO2 98%    BMI 20.81 kg/m  Physical Exam Vitals and nursing note reviewed.  Constitutional:      Appearance: He is well-developed.  HENT:     Head: Normocephalic and atraumatic.  Cardiovascular:     Rate and Rhythm: Normal rate.  Pulmonary:     Effort: Pulmonary effort is normal. No respiratory distress.  Chest:     Chest wall: Tenderness (Over her right pectoral worse with range of motion of his arm) present. No mass.  Abdominal:     General: There is no distension.     Palpations: Abdomen is soft.  Musculoskeletal:        General: Normal range of motion.     Cervical back: Normal range of  motion.  Skin:    General: Skin is warm and dry.  Neurological:     Mental Status: He is alert.    ED Results / Procedures / Treatments   Labs (all labs ordered are listed, but only abnormal results are displayed) Labs Reviewed - No data to display  EKG EKG Interpretation  Date/Time:  Monday August 13 2021 01:07:49 EST Ventricular Rate:  60 PR Interval:  150 QRS Duration: 91 QT Interval:  388 QTC Calculation: 388 R Axis:   82 Text Interpretation: Sinus arrhythmia ST elev, probable normal early repol pattern Confirmed by 1968 [phone]) on 08/13/2021 1:22:54 AM  Radiology DG Chest Portable 1 View  Result Date: 08/13/2021 CLINICAL DATA:  Right-sided chest pain starting last night. EXAM: PORTABLE CHEST 1 VIEW COMPARISON:  07/04/2016 FINDINGS: Heart size and pulmonary vascularity are normal. Lungs are clear. No pleural effusions. No pneumothorax. Mediastinal contours appear intact. Visualized ribs appear intact although rib detail views would be more sensitive for evaluation of rib fractures. IMPRESSION: No active disease. Electronically Signed   By: 07/06/2016 M.D.   On: 08/13/2021 01:19    Procedures Procedures  Medications Ordered in ED Medications  ketorolac (TORADOL) injection 30 mg (30 mg Intramuscular Given 08/13/21 0108)    ED Course/ Medical Decision Making/ A&P                           Medical Decision Making Amount and/or Complexity of Data Reviewed Radiology: ordered. ECG/medicine tests: ordered.  Risk Prescription drug management.   I suspect the patient likely pulled his pectoral muscle as it seems pretty obviously muscular.  Low suspicion for PE, pneumothorax or any cardiac issues with a negative EKG chest x-ray.  Suggested supportive care at home.   Final Clinical Impression(s) / ED Diagnoses Final diagnoses:  Chest wall pain    Rx / DC Orders ED Discharge Orders          Ordered    ibuprofen (ADVIL) 800 MG tablet  3 times daily         08/13/21 0123              [name], Barbara Cower, MD 08/14/21 [phone]

## 2021-10-14 IMAGING — DX DG FINGER THUMB 2+V*R*
3 series · 3 of 3 positions shown · non-contrast
Comparison: None.

CLINICAL DATA: Right thumb pain after trauma.

EXAM:
RIGHT THUMB 2+V

[finger obl]
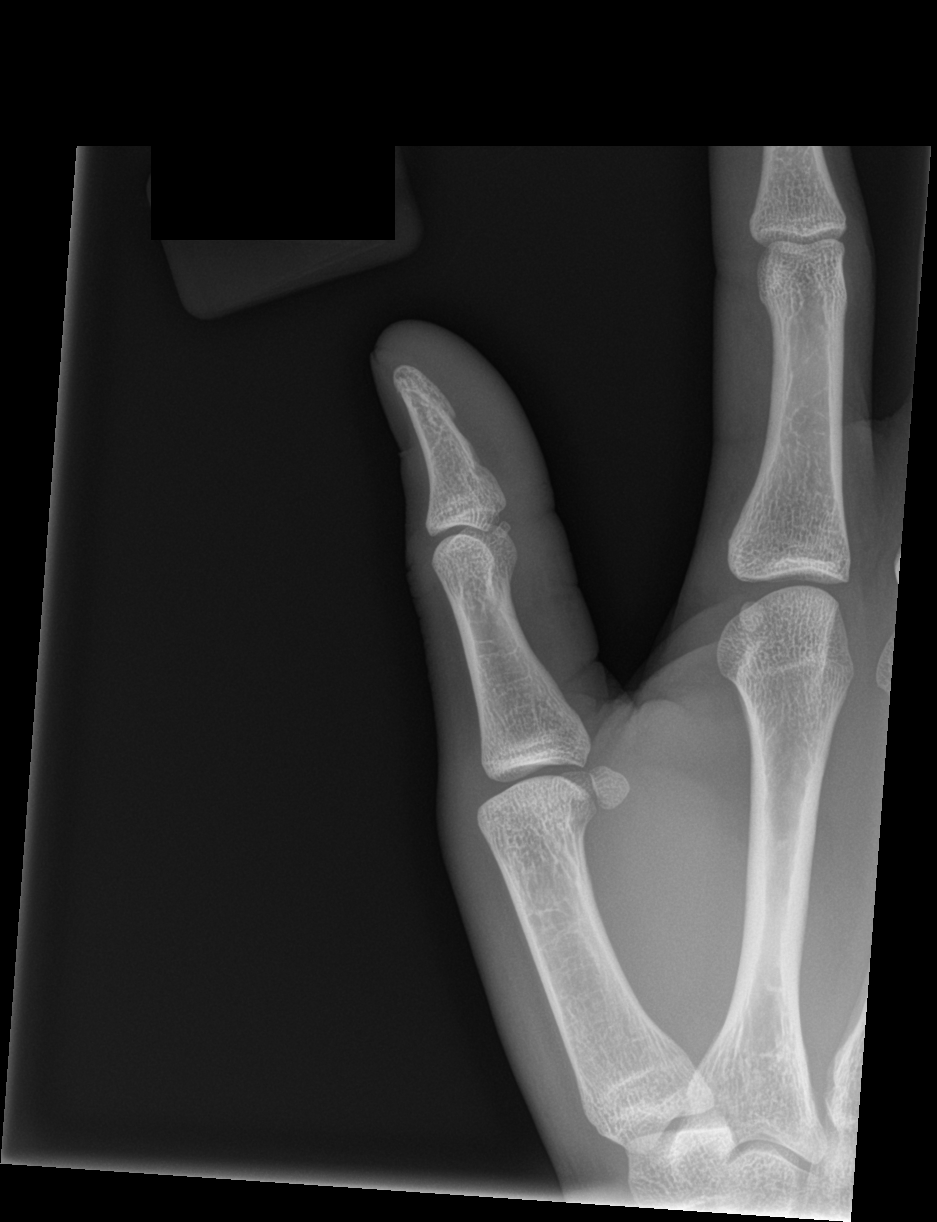

[finger ap]
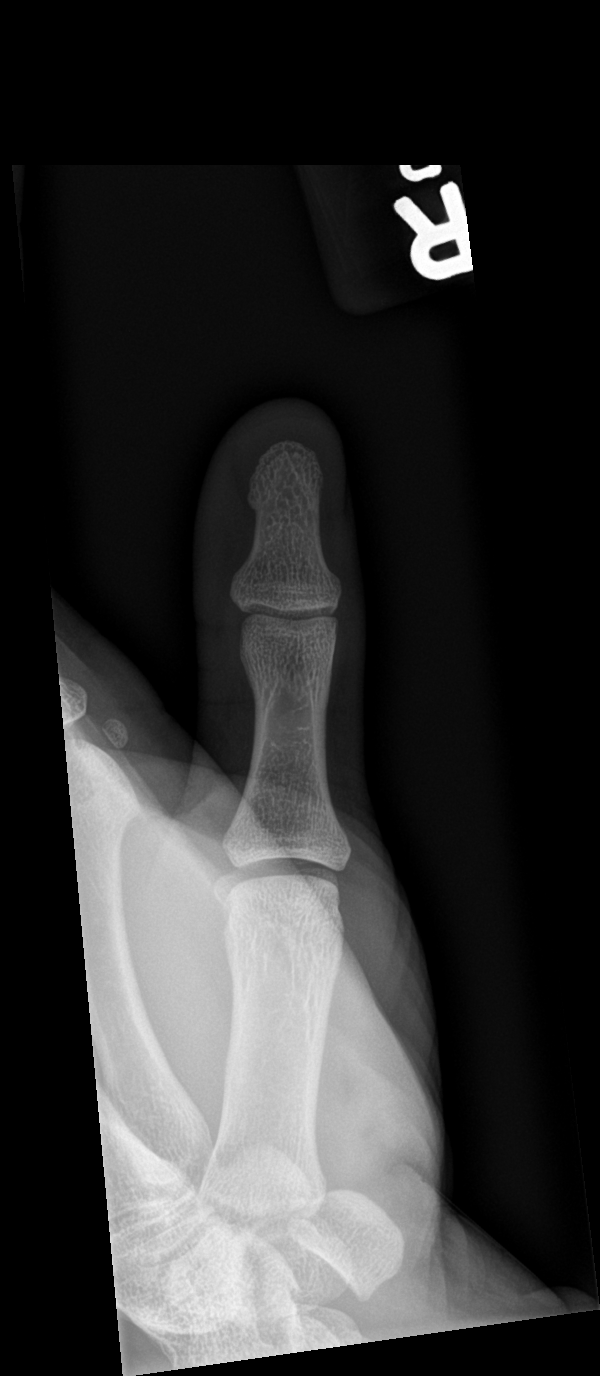

[finger lat]
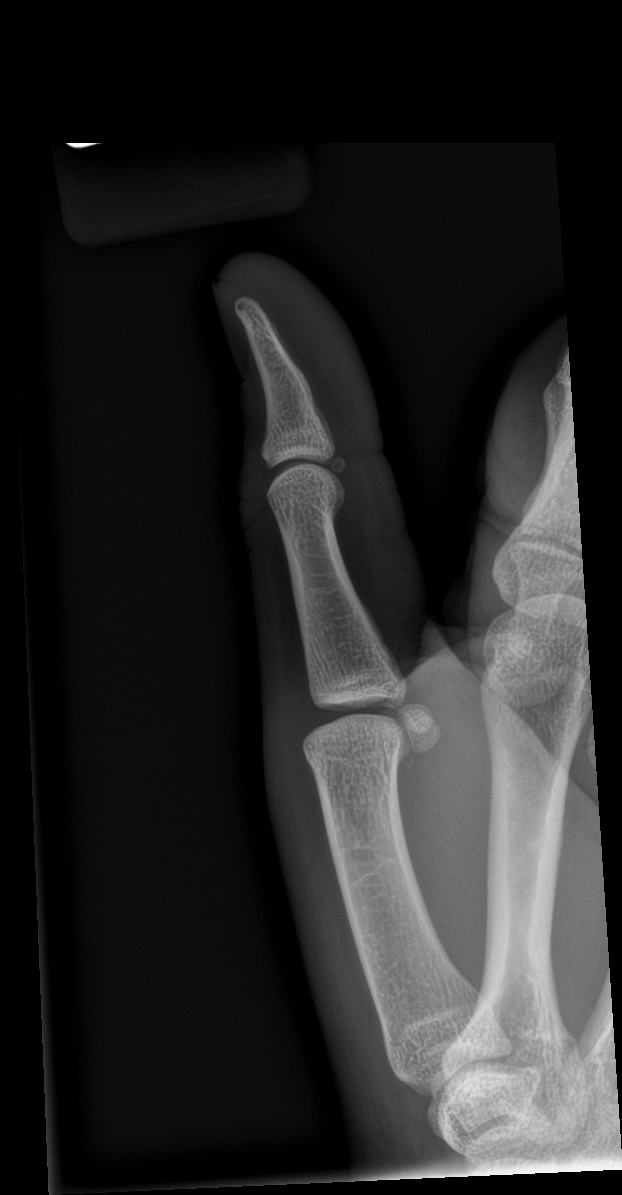

[3 of 3 positions shown; findings below may reference images not displayed]

FINDINGS: There is a subtle curvilinear density just proximal to the sesamoid
bones adjacent to the distal aspect of the first metacarpal. This is
only seen on one view. There is a well corticated calcification at
the interphalangeal joint, likely a sesamoid or sequela of remote
trauma. No other abnormalities are identified.
IMPRESSION: 1. There is a subtle curvilinear density just proximal to the
sesamoid bones adjacent to the distal aspect of the first
metacarpal. This finding is only seen on one view and is
nonspecific. A tiny avulsed fracture fragment is not excluded.
Recommend clinical correlation for point tenderness in this region.
No other acute fractures noted.

## 2022-04-23 ENCOUNTER — Emergency Department (HOSPITAL_COMMUNITY): Payer: Self-pay

## 2022-04-23 ENCOUNTER — Emergency Department (HOSPITAL_COMMUNITY)
Admission: EM | Admit: 2022-04-23 | Discharge: 2022-04-23 | Disposition: A | Discharge status: Home / Self Care | Payer: Self-pay | Attending: Emergency Medicine | Admitting: Emergency Medicine

## 2022-04-23 ENCOUNTER — Encounter (HOSPITAL_COMMUNITY): Payer: Self-pay | Admitting: *Deleted

## 2022-04-23 ENCOUNTER — Other Ambulatory Visit: Payer: Self-pay

## 2022-04-23 DIAGNOSIS — W208XXA Other cause of strike by thrown, projected or falling object, initial encounter: Secondary | ICD-10-CM | POA: Insufficient documentation

## 2022-04-23 DIAGNOSIS — S60041A Contusion of right ring finger without damage to nail, initial encounter: Secondary | ICD-10-CM | POA: Insufficient documentation

## 2022-04-23 DIAGNOSIS — M7989 Other specified soft tissue disorders: Secondary | ICD-10-CM | POA: Insufficient documentation

## 2022-04-23 NOTE — ED Triage Notes (Signed)
Pt states he dropped a railroad beam on his right ring finger yesterday; pt has limited ROM

## 2022-04-23 NOTE — ED Provider Notes (Signed)
[facility] EMERGENCY DEPARTMENT Provider Note   CSN: [id number] Arrival date & time: 04/23/22  0258     History  Chief Complaint  Patient presents with   Finger Injury    Kevin Cortez is a 26 y.o. male.  HPI      Kevin Cortez is a 26 y.o. male who presents to the Emergency Department complaining of pain to the middle phalanx of the right ring finger.  He moved a large, heavy metal beam yesterday when it fell on his finger.  He notes swelling to the area and pain with movement.  He denies open wound, injury of the nail, numbness to his finger or hand.  He is right-hand dominant.   Home Medications Prior to Admission medications   Medication Sig Start Date End Date Taking? Authorizing Provider  ibuprofen (ADVIL) 800 MG tablet Take 1 tablet (800 mg total) by mouth 3 (three) times daily. 08/13/21   Mesner, Barbara Cower, MD  ondansetron (ZOFRAN ODT) 4 MG disintegrating tablet Take 1 tablet (4 mg total) by mouth every 8 (eight) hours as needed for nausea or vomiting. 05/25/21   Roemhildt, Lorin T, PA-C      Allergies    Amoxicillin and Lactose intolerance (gi)    Review of Systems   Review of Systems  Constitutional:  Negative for chills and fever.  Respiratory:  Negative for shortness of breath.   Cardiovascular:  Negative for chest pain.  Musculoskeletal:  Positive for arthralgias (Right ring finger pain and swelling).  Skin:  Negative for color change and wound.  Neurological:  Negative for weakness and numbness.    Physical Exam Updated Vital Signs BP 132/83 (BP Location: Right Arm)   Pulse (!) 55   Temp 98 F (36.7 C) (Oral)   Resp 18   Ht 5\' 9"  (1.753 m)   Wt 68 kg   SpO2 100%   BMI 22.15 kg/m  Physical Exam Vitals and nursing note reviewed.  Constitutional:      General: He is not in acute distress.    Appearance: Normal appearance. He is not ill-appearing or toxic-appearing.  Cardiovascular:     Rate and Rhythm: Normal rate and regular rhythm.      Pulses: Normal pulses.  Pulmonary:     Effort: Pulmonary effort is normal.  Musculoskeletal:        General: Swelling, tenderness and signs of injury present.     Right hand: Tenderness present. Decreased range of motion. Normal sensation. There is no disruption of two-point discrimination. Normal capillary refill. Normal pulse.     Comments: Localized tenderness to palpation of the middle phalanx of the right ring finger.  Mild edema noted.  No injury of the nail.  Sensation of the finger intact.  Some tenderness to the DIP joint.  No bony deformity.  Wrist nontender.  Skin:    General: Skin is warm.     Capillary Refill: Capillary refill takes less than 2 seconds.     Findings: No bruising or erythema.  Neurological:     General: No focal deficit present.     Mental Status: He is alert.     Sensory: No sensory deficit.     Motor: No weakness.     ED Results / Procedures / Treatments   Labs (all labs ordered are listed, but only abnormal results are displayed) Labs Reviewed - No data to display  EKG None  Radiology DG Finger Ring Right  Result Date: 04/23/2022 CLINICAL DATA:  Dropped object on right ring finger yesterday. Finger pain and swelling. EXAM: RIGHT RING FINGER 2+V COMPARISON:  None Available. FINDINGS: There is no evidence of fracture or dislocation. There is no evidence of arthropathy or other focal bone abnormality. Soft tissues are unremarkable. IMPRESSION: Negative. Electronically Signed   By: Marlaine Hind M.D.   On: 04/23/2022 10:52    Procedures Procedures    Medications Ordered in ED Medications - No data to display  ED Course/ Medical Decision Making/ A&P                           Medical Decision Making Patient here for evaluation of injury of the right ring finger.  Describes her direct blow to his finger, no crush injury described.  Pain and swelling along the middle phalanx.  No injury of the nail.  No open wound.  On exam, localized tenderness of  the middle phalanx.  Neurovascularly intact.  There is some mild edema noted.  Distal and proximal  Amount and/or Complexity of Data Reviewed Radiology: ordered.    Details: X-ray of the finger without acute bony injury. Discussion of management or test interpretation with external provider(s): Patient here with likely contusion of the finger.  No nail involvement.  He is neurovascular intact.   Finger splinted for support and comfort.  He will follow-up with orthopedics, agreeable to ice, elevation and ibuprofen if needed for discomfort.           Final Clinical Impression(s) / ED Diagnoses Final diagnoses:  Contusion of right ring finger without damage to nail, initial encounter    Rx / DC Orders ED Discharge Orders     None         Kem Parkinson, PA-C 04/23/22 1112    Milton Ferguson, MD 04/25/22 [phone]

## 2022-04-23 NOTE — Discharge Instructions (Signed)
The x-ray of your finger did not show evidence of a broken bone or dislocation.  I recommend that you wear the finger splint for 1 week.  Elevate and apply ice packs on and off to your finger.  Take ibuprofen, 800 mg 3 times a day with food.  Follow-up with the orthopedic provider listed in 1 week if not improving.

## 2022-04-29 ENCOUNTER — Other Ambulatory Visit: Payer: Self-pay

## 2022-04-29 ENCOUNTER — Emergency Department (HOSPITAL_COMMUNITY): Payer: No Typology Code available for payment source

## 2022-04-29 ENCOUNTER — Emergency Department (HOSPITAL_COMMUNITY)
Admission: EM | Admit: 2022-04-29 | Discharge: 2022-04-29 | Disposition: A | Discharge status: Home / Self Care | Payer: No Typology Code available for payment source | Attending: Emergency Medicine | Admitting: Emergency Medicine

## 2022-04-29 ENCOUNTER — Encounter (HOSPITAL_COMMUNITY): Payer: Self-pay | Admitting: *Deleted

## 2022-04-29 DIAGNOSIS — S60041A Contusion of right ring finger without damage to nail, initial encounter: Secondary | ICD-10-CM | POA: Diagnosis not present

## 2022-04-29 DIAGNOSIS — Y92410 Unspecified street and highway as the place of occurrence of the external cause: Secondary | ICD-10-CM | POA: Insufficient documentation

## 2022-04-29 DIAGNOSIS — S6991XA Unspecified injury of right wrist, hand and finger(s), initial encounter: Secondary | ICD-10-CM | POA: Diagnosis present

## 2022-04-29 DIAGNOSIS — S8001XA Contusion of right knee, initial encounter: Secondary | ICD-10-CM | POA: Diagnosis not present

## 2022-04-29 MED ORDER — IBUPROFEN 800 MG PO TABS: 800.0000 mg | ORAL_TABLET | Freq: Three times a day (TID) | ORAL | 0 refills | Status: DC

## 2022-04-29 MED ORDER — METHOCARBAMOL 500 MG PO TABS: 500.0000 mg | ORAL_TABLET | Freq: Three times a day (TID) | ORAL | 0 refills | Status: AC
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
## 2022-04-29 NOTE — Discharge Instructions (Signed)
Your x-ray was reassuring.  You likely have a contusion of your knee.  You may continue to keep your finger splinted as needed.  Follow-up with orthopedics for recheck.  Return emergency department for any new or worsening symptoms.

## 2022-04-29 NOTE — ED Notes (Signed)
Pt d/c home with family. Discharge summary reviewed, pt verbalizes understanding. Ambulatory off unit. No s/s of acute distress noted at discharge.
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
## 2022-07-30 IMAGING — DX DG CHEST 1V PORT
1 series · 1 of 1 positions shown · non-contrast
Comparison: 07/04/2016

CLINICAL DATA: Right-sided chest pain starting last night.

EXAM:
PORTABLE CHEST 1 VIEW

[chest ap]
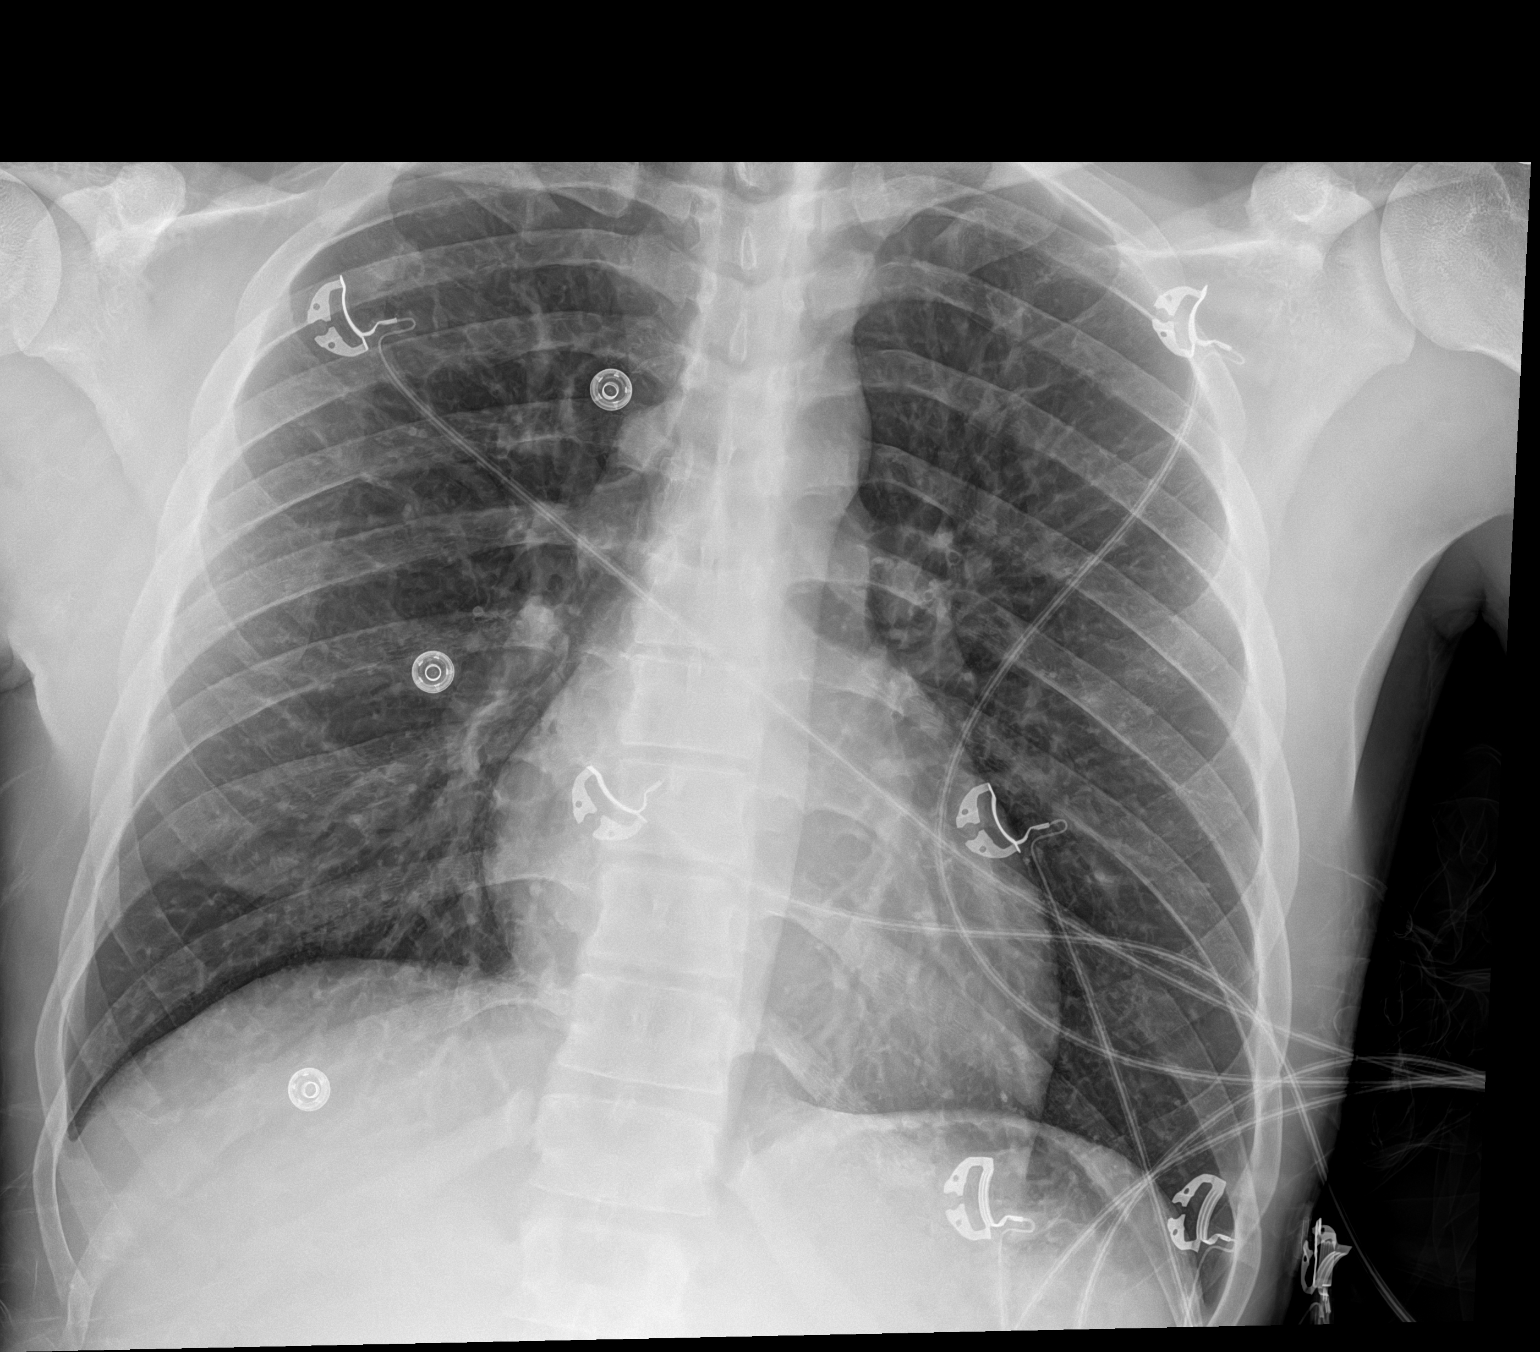

[1 of 1 positions shown; findings below may reference images not displayed]

FINDINGS: Heart size and pulmonary vascularity are normal. Lungs are clear. No
pleural effusions. No pneumothorax. Mediastinal contours appear
intact. Visualized ribs appear intact although rib detail views
would be more sensitive for evaluation of rib fractures.
IMPRESSION: No active disease.

## 2022-12-24 ENCOUNTER — Emergency Department (HOSPITAL_COMMUNITY): Payer: Self-pay

## 2022-12-24 ENCOUNTER — Other Ambulatory Visit: Payer: Self-pay

## 2022-12-24 ENCOUNTER — Emergency Department (HOSPITAL_COMMUNITY)
Admission: EM | Admit: 2022-12-24 | Discharge: 2022-12-24 | Disposition: A | Discharge status: Home / Self Care | Payer: Self-pay | Attending: Emergency Medicine | Admitting: Emergency Medicine

## 2022-12-24 ENCOUNTER — Encounter (HOSPITAL_COMMUNITY): Payer: Self-pay | Admitting: Emergency Medicine

## 2022-12-24 DIAGNOSIS — M7989 Other specified soft tissue disorders: Secondary | ICD-10-CM | POA: Insufficient documentation

## 2022-12-24 DIAGNOSIS — R110 Nausea: Secondary | ICD-10-CM | POA: Insufficient documentation

## 2022-12-24 LAB — COMPREHENSIVE METABOLIC PANEL
ALT: 22 U/L (ref 0–44)
AST: 34 U/L (ref 15–41)
Albumin: 5.3 g/dL — ABNORMAL HIGH (ref 3.5–5.0)
Alkaline Phosphatase: 61 U/L (ref 38–126)
Anion gap: 13 (ref 5–15)
BUN: 27 mg/dL — ABNORMAL HIGH (ref 6–20)
CO2: 25 mmol/L (ref 22–32)
Calcium: 10.3 mg/dL (ref 8.9–10.3)
Chloride: 98 mmol/L (ref 98–111)
Creatinine, Ser: 1.23 mg/dL (ref 0.61–1.24)
GFR, Estimated: >60 mL/min (ref >60)
Glucose, Bld: 87 mg/dL (ref 70–99)
Potassium: 4.2 mmol/L (ref 3.5–5.1)
Sodium: 136 mmol/L (ref 135–145)
Total Bilirubin: 2.2 mg/dL — ABNORMAL HIGH (ref 0.3–1.2)
Total Protein: 9.1 g/dL — ABNORMAL HIGH (ref 6.5–8.1)

## 2022-12-24 LAB — CBC WITH DIFFERENTIAL/PLATELET
Abs Immature Granulocytes: 0 10*3/uL (ref 0.00–0.07)
Basophils Absolute: 0 10*3/uL (ref 0.0–0.1)
Basophils Relative: 0 %
Eosinophils Absolute: 0 10*3/uL (ref 0.0–0.5)
Eosinophils Relative: 0 %
HCT: 55 % — ABNORMAL HIGH (ref 39.0–52.0)
Hemoglobin: 18.9 g/dL — ABNORMAL HIGH (ref 13.0–17.0)
Lymphocytes Relative: 53 %
Lymphs Abs: 2 10*3/uL (ref 0.7–4.0)
MCH: 30.2 pg (ref 26.0–34.0)
MCHC: 34.4 g/dL (ref 30.0–36.0)
MCV: 87.9 fL (ref 80.0–100.0)
Monocytes Absolute: 0.1 10*3/uL (ref 0.1–1.0)
Monocytes Relative: 3 %
Neutro Abs: 1.7 10*3/uL (ref 1.7–7.7)
Neutrophils Relative %: 44 %
Platelets: 238 10*3/uL (ref 150–400)
RBC: 6.26 MIL/uL — ABNORMAL HIGH (ref 4.22–5.81)
RDW: 11.9 % (ref 11.5–15.5)
WBC: 3.8 10*3/uL — ABNORMAL LOW (ref 4.0–10.5)
nRBC: 0 % (ref 0.0–0.2)

## 2022-12-24 LAB — LIPASE, BLOOD: Lipase: 27 U/L (ref 11–51)

## 2022-12-24 MED ORDER — ONDANSETRON HCL 4 MG PO TABS: 4.0000 mg | ORAL_TABLET | Freq: Three times a day (TID) | ORAL | 0 refills | Status: DC | PRN

## 2022-12-24 MED ORDER — FAMOTIDINE 20 MG PO TABS: 20.0000 mg | ORAL_TABLET | Freq: Every day | ORAL | 0 refills | Status: AC

## 2022-12-24 MED ORDER — ONDANSETRON 4 MG PO TBDP
4.0000 mg | ORAL_TABLET | Freq: Once | ORAL | Status: AC
  Administered 2022-12-24: 4 mg via ORAL
  Filled 2022-12-24: qty 1

## 2022-12-24 NOTE — ED Provider Notes (Signed)
[place] EMERGENCY DEPARTMENT AT [facility] Provider Note   CSN: [id number] Arrival date & time: 12/24/22  1633     History  Chief Complaint  Patient presents with   Nausea    Kevin Cortez is a 27 y.o. male.  Who presents emergency department chief complaint of nausea.  Patient states that he tried to get patterns from [employer] 2 days ago and has had mild nausea over the past 48 hours.  He states this morning woke up with poor appetite and feeling weak.  He had no abdominal pain and has no history of reflux.  He has no symptoms at this time.  Patient also injured his finger, left middle a week ago after he jammed it.  He was concerned he might have again because it still little bit sore.  HPI     Home Medications Prior to Admission medications   Medication Sig Start Date End Date Taking? Authorizing Provider  ibuprofen (ADVIL) 800 MG tablet Take 1 tablet (800 mg total) by mouth 3 (three) times daily. Take with food 04/29/22   Triplett, Tammy, PA-C  methocarbamol (ROBAXIN) 500 MG tablet Take 1 tablet (500 mg total) by mouth 3 (three) times daily. 04/29/22   Triplett, Tammy, PA-C  ondansetron (ZOFRAN ODT) 4 MG disintegrating tablet Take 1 tablet (4 mg total) by mouth every 8 (eight) hours as needed for nausea or vomiting. 05/25/21   Roemhildt, Lorin T, PA-C      Allergies    Amoxicillin and Lactose intolerance (gi)    Review of Systems   Review of Systems  Physical Exam Updated Vital Signs BP (!) 126/106   Pulse 86   Temp 98.5 F (36.9 C) (Oral)   Resp 20   Ht 5\' 10"  (1.778 m)   Wt 68.5 kg   SpO2 100%   BMI 21.67 kg/m  Physical Exam Physical Exam  Nursing note and vitals reviewed. Constitutional: He appears well-developed and well-nourished. No distress.  HENT:  Head: Normocephalic and atraumatic.  Eyes: Conjunctivae normal are normal. No scleral icterus.  Neck: Normal range of motion. Neck supple.  Cardiovascular: Normal rate, regular rhythm and  normal heart sounds.   Pulmonary/Chest: Effort normal and breath sounds normal. No respiratory distress.  Abdominal: Soft. There is no tenderness.  Musculoskeletal: He exhibits no edema.  Soft tissue swelling around the third left finger.  Decreased range of motion without significant tenderness Neurological: He is alert.  Skin: Skin is warm and dry. He is not diaphoretic.  Psychiatric: His behavior is normal.   ED Results / Procedures / Treatments   Labs (all labs ordered are listed, but only abnormal results are displayed) Labs Reviewed  COMPREHENSIVE METABOLIC PANEL - Abnormal; Notable for the following components:      Result Value   BUN 27 (*)    Total Protein 9.1 (*)    Albumin 5.3 (*)    Total Bilirubin 2.2 (*)    All other components within normal limits  CBC WITH DIFFERENTIAL/PLATELET - Abnormal; Notable for the following components:   WBC 3.8 (*)    RBC 6.26 (*)    Hemoglobin 18.9 (*)    HCT 55.0 (*)    All other components within normal limits  LIPASE, BLOOD    EKG None  Radiology DG Finger Middle Left  Result Date: 12/24/2022 CLINICAL DATA:  Status post trauma 1 week ago. EXAM: LEFT MIDDLE FINGER 2+V COMPARISON:  None Available. FINDINGS: There is no evidence of fracture or  dislocation. There is no evidence of arthropathy or other focal bone abnormality. Mild soft tissue swelling is seen surrounding the PIP joint of the third left finger. IMPRESSION: Mild soft tissue swelling surrounding the PIP joint of the third left finger without evidence of an acute osseous abnormality. Electronically Signed   By: Aram Candela M.D.   On: 12/24/2022 18:36    Procedures Procedures    Medications Ordered in ED Medications  ondansetron (ZOFRAN-ODT) disintegrating tablet 4 mg (4 mg Oral Given 12/24/22 1847)    ED Course/ Medical Decision Making/ A&P                             Medical Decision Making Amount and/or Complexity of Data Reviewed Labs:  ordered. Radiology: ordered.  Risk Prescription drug management.   Patient here with nausea no active vomiting suspect upset stomach or dyspepsia.  I visualized interpreted a finger x-ray which shows no evidence of fracture.  I ordered and reviewed labs which shows no evidence of abnormality.  He has no nausea at this time.  Will discharge with Pepcid and Zofran.        Final Clinical Impression(s) / ED Diagnoses Final diagnoses:  None    Rx / DC Orders ED Discharge Orders     None         Arthor Captain, PA-C 12/24/22 2357    Vanetta Mulders, MD 12/26/22 1406

## 2022-12-24 NOTE — ED Triage Notes (Signed)
Pt c/o nausea and poor PO tolerance this morning. He also states his left middle finger was jammed a week ago and has not improved.

## 2023-04-22 ENCOUNTER — Emergency Department (HOSPITAL_COMMUNITY)
Admission: EM | Admit: 2023-04-22 | Discharge: 2023-04-22 | Disposition: A | Discharge status: Home / Self Care | Payer: Self-pay | Attending: Emergency Medicine | Admitting: Emergency Medicine

## 2023-04-22 ENCOUNTER — Other Ambulatory Visit: Payer: Self-pay

## 2023-04-22 ENCOUNTER — Emergency Department (HOSPITAL_COMMUNITY): Payer: Self-pay

## 2023-04-22 ENCOUNTER — Encounter (HOSPITAL_COMMUNITY): Payer: Self-pay | Admitting: Emergency Medicine

## 2023-04-22 DIAGNOSIS — Z1152 Encounter for screening for COVID-19: Secondary | ICD-10-CM | POA: Insufficient documentation

## 2023-04-22 DIAGNOSIS — J069 Acute upper respiratory infection, unspecified: Secondary | ICD-10-CM | POA: Insufficient documentation

## 2023-04-22 LAB — SARS CORONAVIRUS 2 BY RT PCR: SARS Coronavirus 2 by RT PCR: NEGATIVE

## 2023-04-22 MED ORDER — BENZONATATE 200 MG PO CAPS: 200.0000 mg | ORAL_CAPSULE | Freq: Three times a day (TID) | ORAL | 0 refills | Status: AC

## 2023-04-22 MED ORDER — BENZONATATE 100 MG PO CAPS
200.0000 mg | ORAL_CAPSULE | Freq: Once | ORAL | Status: AC
  Administered 2023-04-22: 200 mg via ORAL
  Filled 2023-04-22: qty 2

## 2023-04-22 MED ORDER — ONDANSETRON HCL 4 MG PO TABS: 4.0000 mg | ORAL_TABLET | Freq: Four times a day (QID) | ORAL | 0 refills | Status: AC

## 2023-04-22 MED ORDER — IBUPROFEN 800 MG PO TABS
800.0000 mg | ORAL_TABLET | Freq: Once | ORAL | Status: AC
  Administered 2023-04-22: 800 mg via ORAL
  Filled 2023-04-22: qty 1

## 2023-04-22 MED ORDER — IBUPROFEN 800 MG PO TABS: 800.0000 mg | ORAL_TABLET | Freq: Three times a day (TID) | ORAL | 0 refills | Status: AC

## 2023-04-22 NOTE — ED Provider Notes (Signed)
[place] EMERGENCY DEPARTMENT AT [facility] Provider Note   CSN: [id number] Arrival date & time: 04/22/23  0744     History  Chief Complaint  Patient presents with   Flu-Like    LADELL LEA is a 27 y.o. male.  HPI     FARRELL PANTALEO is a 27 y.o. male who presents to the Emergency Department complaining of generalized bodyaches, diffuse headache, and cough.  Symptoms present for 3 days.  Describes "aching all over" headache yesterday that he describes as throbbing in quality and similar to previous headaches.  Currently headache is improved since ER arrival.  Cough mostly nonproductive.  1 episode of vomiting yesterday.  Tolerating orals today.  Denies any chest pain abdominal pain or shortness of breath, no fever, chills, visual changes neck pain or stiffness.  Home Medications Prior to Admission medications   Medication Sig Start Date End Date Taking? Authorizing Provider  famotidine (PEPCID) 20 MG tablet Take 1 tablet (20 mg total) by mouth daily. 12/24/22   Arthor Captain, PA-C  ibuprofen (ADVIL) 800 MG tablet Take 1 tablet (800 mg total) by mouth 3 (three) times daily. Take with food 04/29/22   Ziyon Soltau, PA-C  methocarbamol (ROBAXIN) 500 MG tablet Take 1 tablet (500 mg total) by mouth 3 (three) times daily. 04/29/22   Anida Deol, PA-C  ondansetron (ZOFRAN ODT) 4 MG disintegrating tablet Take 1 tablet (4 mg total) by mouth every 8 (eight) hours as needed for nausea or vomiting. 05/25/21   Roemhildt, Lorin T, PA-C  ondansetron (ZOFRAN) 4 MG tablet Take 1 tablet (4 mg total) by mouth every 8 (eight) hours as needed for nausea or vomiting. 12/24/22   Arthor Captain, PA-C      Allergies    Amoxicillin and Lactose intolerance (gi)    Review of Systems   Review of Systems  Constitutional:  Negative for appetite change, chills and fever.  HENT:  Positive for congestion. Negative for sore throat and trouble swallowing.   Respiratory:  Positive for  cough. Negative for shortness of breath.   Cardiovascular:  Negative for chest pain.  Gastrointestinal:  Positive for vomiting. Negative for abdominal pain and diarrhea.  Genitourinary:  Negative for dysuria, flank pain and frequency.  Musculoskeletal:  Positive for myalgias. Negative for arthralgias, neck pain and neck stiffness.  Skin:  Negative for rash and wound.  Neurological:  Negative for dizziness, weakness, numbness and headaches.    Physical Exam Updated Vital Signs BP (!) 134/103   Pulse 91   Temp 99.8 F (37.7 C) (Oral)   Resp 18   Ht 5\' 10"  (1.778 m)   Wt 70.3 kg   SpO2 96%   BMI 22.24 kg/m  Physical Exam Vitals and nursing note reviewed.  Constitutional:      General: He is not in acute distress.    Appearance: Normal appearance. He is not ill-appearing or toxic-appearing.  HENT:     Right Ear: Tympanic membrane and ear canal normal.     Left Ear: Tympanic membrane and ear canal normal.     Mouth/Throat:     Mouth: Mucous membranes are moist.     Pharynx: No oropharyngeal exudate or posterior oropharyngeal erythema.  Cardiovascular:     Rate and Rhythm: Normal rate and regular rhythm.     Pulses: Normal pulses.  Pulmonary:     Effort: Pulmonary effort is normal.  Abdominal:     Palpations: Abdomen is soft.  Musculoskeletal:  Cervical back: No rigidity.     Right lower leg: No edema.     Left lower leg: No edema.  Skin:    General: Skin is warm.     Capillary Refill: Capillary refill takes less than 2 seconds.  Neurological:     General: No focal deficit present.     Mental Status: He is alert.     Motor: No weakness.     ED Results / Procedures / Treatments   Labs (all labs ordered are listed, but only abnormal results are displayed) Labs Reviewed  SARS CORONAVIRUS 2 BY RT PCR    EKG None  Radiology DG Chest Portable 1 View  Result Date: 04/22/2023 CLINICAL DATA:  Cough EXAM: PORTABLE CHEST 1 VIEW COMPARISON:  X-ray 08/13/2021  FINDINGS: The heart size and mediastinal contours are within normal limits. No consolidation, pneumothorax or effusion. No edema. The visualized skeletal structures are unremarkable. IMPRESSION: No acute cardiopulmonary disease. Electronically Signed   By: Karen Kays M.D.   On: 04/22/2023 10:06    Procedures Procedures    Medications Ordered in ED Medications  benzonatate (TESSALON) capsule 200 mg (has no administration in time range)  ibuprofen (ADVIL) tablet 800 mg (has no administration in time range)    ED Course/ Medical Decision Making/ A&P                                 Medical Decision Making Patient here with URI type symptoms for 3 days.  Diffuse headache gradual in onset.  Similar to previous headaches.  No meningeal signs.  Describes generalized bodyaches with cough 1 episode of vomiting yesterday.  No hematemesis, no diarrhea  On exam, patient well-appearing nontoxic.  I suspect viral process.  Possible COVID.  Will obtain COVID test and chest x-ray  Amount and/or Complexity of Data Reviewed Labs: ordered.    Details: COVID test negative Radiology: ordered.    Details: Chest x-ray without acute cardiopulmonary finding Discussion of management or test interpretation with external provider(s): I suspect viral process.  Patient agreeable to symptomatic treatment and PCP follow-up if needed.  Return precautions were discussed.  Risk Prescription drug management.           Final Clinical Impression(s) / ED Diagnoses Final diagnoses:  Viral URI with cough    Rx / DC Orders ED Discharge Orders     None         Pauline Aus, PA-C 04/22/23 1027    Bethann Berkshire, MD 04/24/23 1710

## 2023-04-22 NOTE — ED Triage Notes (Signed)
Pt presents with headache, cough and generalized body aches for 2-3 days.

## 2023-04-22 NOTE — Discharge Instructions (Signed)
Your COVID test today was negative.  You likely have a upper respiratory infection.  This does not require antibiotics.  Drink plenty of fluids take the medication as directed.
